# Patient Record
Sex: Female | Born: 1935 | Race: White | Hispanic: No | Smoking: Former smoker
Health system: Southern US, Community
[De-identification: ages and names within clinical notes are randomized; demographics above are authoritative.]

## PROBLEM LIST (undated history)

## (undated) DIAGNOSIS — J449 Chronic obstructive pulmonary disease, unspecified: Secondary | ICD-10-CM

## (undated) DIAGNOSIS — C679 Malignant neoplasm of bladder, unspecified: Secondary | ICD-10-CM

## (undated) DIAGNOSIS — J45909 Unspecified asthma, uncomplicated: Secondary | ICD-10-CM

## (undated) DIAGNOSIS — I10 Essential (primary) hypertension: Secondary | ICD-10-CM

## (undated) DIAGNOSIS — K589 Irritable bowel syndrome without diarrhea: Secondary | ICD-10-CM

## (undated) DIAGNOSIS — N3090 Cystitis, unspecified without hematuria: Secondary | ICD-10-CM

## (undated) DIAGNOSIS — R319 Hematuria, unspecified: Secondary | ICD-10-CM

## (undated) DIAGNOSIS — K219 Gastro-esophageal reflux disease without esophagitis: Secondary | ICD-10-CM

## (undated) DIAGNOSIS — E538 Deficiency of other specified B group vitamins: Secondary | ICD-10-CM

## (undated) DIAGNOSIS — N6019 Diffuse cystic mastopathy of unspecified breast: Secondary | ICD-10-CM

## (undated) DIAGNOSIS — Z8719 Personal history of other diseases of the digestive system: Secondary | ICD-10-CM

## (undated) DIAGNOSIS — E785 Hyperlipidemia, unspecified: Secondary | ICD-10-CM

## (undated) HISTORY — DX: Gastro-esophageal reflux disease without esophagitis: K21.9

## (undated) HISTORY — DX: Chronic obstructive pulmonary disease, unspecified: J44.9

## (undated) HISTORY — DX: Malignant neoplasm of bladder, unspecified: C67.9

## (undated) HISTORY — DX: Hematuria, unspecified: R31.9

## (undated) HISTORY — DX: Cystitis, unspecified without hematuria: N30.90

---

## 1962-02-05 HISTORY — PX: BREAST CYST EXCISION: SHX579

## 1977-02-05 HISTORY — PX: PARTIAL HYSTERECTOMY: SHX80

## 2004-01-04 ENCOUNTER — Ambulatory Visit: Payer: Self-pay | Admitting: Unknown Physician Specialty

## 2004-07-20 ENCOUNTER — Ambulatory Visit: Payer: Self-pay | Admitting: Unknown Physician Specialty

## 2004-10-10 ENCOUNTER — Ambulatory Visit: Payer: Self-pay | Admitting: Internal Medicine

## 2004-11-07 ENCOUNTER — Ambulatory Visit: Payer: Self-pay | Admitting: Internal Medicine

## 2004-12-20 ENCOUNTER — Ambulatory Visit: Payer: Self-pay | Admitting: Internal Medicine

## 2005-09-06 ENCOUNTER — Ambulatory Visit: Payer: Self-pay | Admitting: Internal Medicine

## 2005-10-17 ENCOUNTER — Ambulatory Visit: Payer: Self-pay | Admitting: Internal Medicine

## 2006-03-20 ENCOUNTER — Ambulatory Visit: Payer: Self-pay | Admitting: Unknown Physician Specialty

## 2006-04-03 ENCOUNTER — Ambulatory Visit: Payer: Self-pay | Admitting: Internal Medicine

## 2006-09-10 ENCOUNTER — Ambulatory Visit: Payer: Self-pay | Admitting: Internal Medicine

## 2006-10-30 ENCOUNTER — Ambulatory Visit: Payer: Self-pay | Admitting: Internal Medicine

## 2007-09-29 ENCOUNTER — Encounter: Payer: Self-pay | Admitting: Unknown Physician Specialty

## 2007-10-06 ENCOUNTER — Ambulatory Visit: Payer: Self-pay | Admitting: Internal Medicine

## 2007-10-07 ENCOUNTER — Encounter: Payer: Self-pay | Admitting: Unknown Physician Specialty

## 2007-12-07 ENCOUNTER — Encounter: Payer: Self-pay | Admitting: Unknown Physician Specialty

## 2008-01-06 DIAGNOSIS — J439 Emphysema, unspecified: Secondary | ICD-10-CM

## 2008-01-06 DIAGNOSIS — K219 Gastro-esophageal reflux disease without esophagitis: Secondary | ICD-10-CM

## 2008-01-07 ENCOUNTER — Ambulatory Visit: Payer: Self-pay | Admitting: Internal Medicine

## 2008-01-09 ENCOUNTER — Telehealth (INDEPENDENT_AMBULATORY_CARE_PROVIDER_SITE_OTHER): Payer: Self-pay | Admitting: *Deleted

## 2008-03-10 ENCOUNTER — Ambulatory Visit: Payer: Self-pay | Admitting: Internal Medicine

## 2008-04-16 ENCOUNTER — Telehealth (INDEPENDENT_AMBULATORY_CARE_PROVIDER_SITE_OTHER): Payer: Self-pay | Admitting: *Deleted

## 2008-06-24 ENCOUNTER — Ambulatory Visit: Payer: Self-pay | Admitting: Internal Medicine

## 2008-06-24 DIAGNOSIS — I10 Essential (primary) hypertension: Secondary | ICD-10-CM | POA: Insufficient documentation

## 2008-10-07 ENCOUNTER — Ambulatory Visit: Payer: Self-pay | Admitting: Internal Medicine

## 2009-01-20 DIAGNOSIS — N3020 Other chronic cystitis without hematuria: Secondary | ICD-10-CM | POA: Insufficient documentation

## 2009-02-01 ENCOUNTER — Ambulatory Visit: Payer: Self-pay | Admitting: Urology

## 2009-02-07 DIAGNOSIS — R3129 Other microscopic hematuria: Secondary | ICD-10-CM

## 2009-02-10 ENCOUNTER — Ambulatory Visit: Payer: Self-pay | Admitting: Urology

## 2009-02-23 ENCOUNTER — Ambulatory Visit: Payer: Self-pay | Admitting: Urology

## 2009-03-08 DIAGNOSIS — C672 Malignant neoplasm of lateral wall of bladder: Secondary | ICD-10-CM

## 2009-05-20 DIAGNOSIS — N3946 Mixed incontinence: Secondary | ICD-10-CM

## 2009-05-20 DIAGNOSIS — N993 Prolapse of vaginal vault after hysterectomy: Secondary | ICD-10-CM

## 2009-07-07 ENCOUNTER — Ambulatory Visit: Payer: Self-pay | Admitting: Internal Medicine

## 2009-08-01 ENCOUNTER — Ambulatory Visit: Payer: Self-pay | Admitting: Internal Medicine

## 2009-08-01 ENCOUNTER — Encounter: Payer: Self-pay | Admitting: Internal Medicine

## 2009-08-18 ENCOUNTER — Ambulatory Visit: Payer: Self-pay | Admitting: Internal Medicine

## 2009-09-28 ENCOUNTER — Telehealth (INDEPENDENT_AMBULATORY_CARE_PROVIDER_SITE_OTHER): Payer: Self-pay | Admitting: *Deleted

## 2009-10-11 ENCOUNTER — Ambulatory Visit: Payer: Self-pay | Admitting: Internal Medicine

## 2009-11-24 ENCOUNTER — Ambulatory Visit: Payer: Self-pay | Admitting: Internal Medicine

## 2009-11-24 DIAGNOSIS — B370 Candidal stomatitis: Secondary | ICD-10-CM

## 2009-11-28 ENCOUNTER — Telehealth (INDEPENDENT_AMBULATORY_CARE_PROVIDER_SITE_OTHER): Payer: Self-pay | Admitting: *Deleted

## 2009-12-13 ENCOUNTER — Ambulatory Visit: Payer: Self-pay | Admitting: Internal Medicine

## 2010-01-26 ENCOUNTER — Ambulatory Visit: Payer: Self-pay | Admitting: Internal Medicine

## 2010-02-21 ENCOUNTER — Ambulatory Visit: Payer: Self-pay | Admitting: Unknown Physician Specialty

## 2010-02-23 LAB — PATHOLOGY REPORT

## 2010-03-07 NOTE — Assessment & Plan Note (Signed)
Summary: Pulmonary/ f/u sinus flare, hfa 25% p coaching but doing well   Visit Type:  Follow-up Primary Provider/Referring Provider:  Dr. Bethann Punches  CC:  COPD follow-up...no changes in her breathing  on Symbicort...thrush has cleared...rare use of Combivent.  History of Present Illness: 69  yowf quit smoking in 1964  history of of COPD  GOLD I by PFT's   March 10, 2008  ov completely back to baseline, no doe but very sedentary, no sign cough or nocturnal or early am symptoms or need for rescue rx.  Jun 24, 2008 not really but bad reflux, stopped boniva, no better.  Only occ active enough to be sob with activity, no real variability or increase need for combivent.   July 07, 2009  Acute visit on lisinopril  Pt c/o chest tightness and mild increase in SOB over the past month.  She has been using combivent 2 x per day. She relates symptoms to stress at home.  She has had some cough over the past few days- prod with minimal clear sputum. Try off Lisinopril Diovan 80/12.5 for at least 6 weeks Work on inhaler technique:  relax and blow all the way out then take a nice smooth deep breath back in, triggering the inhaler at same time you start breathing in   Prednisone 10 mg 4 each am x 2days, 2x2days, 1x2days and stop Please schedule a follow-up appointment in 6 weeks, sooner if needed with PFT's and cxr on return  August 18, 2009 Followup to review PFT's.  Pt states that her breathing has been doing well.  She denies any complaints today. Cough has improved.   November 24, 2009--Presents for an acute office visit. Pt states was seen by Dr. Marion Downer ENT and  dx with thrush and treated with Majic Mouthwash. Dr. Willeen Cass found "white patches" in throat during scope and advised pt it was from using Advair. Pt has once day of Advair left.  Initially had persistent sore throat that prompted her to go to ENT. Since last visit her breathing has been doing well w/ no flare in symptom cough/wheezing or  dyspnea. rec change symbicort 80 instead of advair  December 13, 2009 COPD follow-up...no changes in her breathing  on Symbicort...thrush has cleared...rare use of Combivent,  rx with diflucan then needed augmentin for sinus flare x 7dasy completeled Nov 1.  presently Pt denies any   sore throat, dysphagia, itching, sneezing, cp, swelling.  Current Medications (verified): 1)  Symbicort 80-4.5 Mcg/act Aero (Budesonide-Formoterol Fumarate) .... 2 Puffs Two Times A Day 2)  Fluticasone Propionate 50 Mcg/act Susp (Fluticasone Propionate) .... 2 Sprays Each Nostril Once Daily 3)  Multivitamins  Tabs (Multiple Vitamin) .... Take 1 Tablet By Mouth Once A Day 4)  Nexium 40 Mg Cpdr (Esomeprazole Magnesium) .... Take 1 Tablet By Mouth Every Morning 5)  Calcium 600/vitamin D 600-400 Mg-Unit Tabs (Calcium Carbonate-Vitamin D) .... Take 1 Tablet By Mouth Once A Day 6)  Allegra Odt 30 Mg Tbdp (Fexofenadine Hcl) .Marland Kitchen.. 1 Once Daily As Needed 7)  Avalide 300-12.5 Mg Tabs (Irbesartan-Hydrochlorothiazide) .... One Half Daily 8)  Zantac 150 Maximum Strength 150 Mg Tabs (Ranitidine Hcl) .... Take 1 Tab By Mouth At Bedtime 9)  Vitamin D 1000 Unit Tabs (Cholecalciferol) .... Take 1 Tablet By Mouth Once A Day 10)  Combivent 103-18 Mcg/act Aero (Ipratropium-Albuterol) .... Inhale 2 Puffs Every 4 Hours As Needed  Allergies (verified): 1)  ! Sulfa  Past History:  Past Medical  History: G E R D (ICD-530.81)     - try off ace July 07, 2009 > symptoms resolved C O P D (ICD-496)    - PFT's 12/20/04 FEV1 1.81 (107%) ratio 57,  DLC0 81%,  no resopnse to B2    - PFT's 08/01/09   FEV1 1.64 (104%) ratio 54  DLCO 60, corrects to 71%    - HFA 25% December 13, 2009  OSTEOPOROSIS  Vital Signs:  Patient profile:   77 year old female Height:      61 inches (154.94 cm) Weight:      124 pounds (56.36 kg) BMI:     23.51 O2 Sat:      97 % on Room air Temp:     97.8 degrees F (36.56 degrees C) oral Pulse rate:   70 / minute BP  sitting:   116 / 74  (left arm) Cuff size:   regular  Vitals Entered By: Michel Bickers CMA (December 13, 2009 8:56 AM)  O2 Sat at Rest %:  97 O2 Flow:  Room air CC: COPD follow-up...no changes in her breathing  on Symbicort...thrush has cleared...rare use of Combivent Comments Medications reviewed with patient Michel Bickers [facility]  December 13, 2009 8:57 AM   Physical Exam  Additional Exam:  wt   128  Jun 24, 2008 > 127 July 07, 2009> 127 August 18, 2009    >>123 November 24, 2009 > 121 December 13, 2009  amb pleasant wf  HEENT mild turbinate edema.  Oropharynx no thrush or excess pnd or cobblestoning.  No JVD or cervical adenopathy. Mild accessory muscle hypertrophy. Trachea midline, nl thryroid. Chest was hyperinflated by percussion with diminished breath sounds and moderate increased exp time without wheeze. Hoover sign positive at mid inspiration. Regular rate and rhythm without murmur gallop or rub or increase P2 or edema.  Abd: no hsm, nl excursion. Ext warm without cyanosis or clubbing.     Impression & Recommendations:  Problem # 1:  C O P D (ICD-496)   DDX of  difficult airways managment all start with A and  include Adherence, Ace Inhibitors, Acid Reflux, Active Sinus Disease, Alpha 1 Antitripsin deficiency, Anxiety masquerading as Airways dz,  ABPA,  allergy(esp in young), Aspiration (esp in elderly), Adverse effects of DPI,  Active smokers, plus one B  = Beta blocker use..   Adherence still not optimal.  not using symbicort effectively nor first thing in am as rec but saba use way down I spent extra time with the patient today explaining optimal mdi  technique.  This improved from 10-25% which means she probably doesn't have much of an allergic/ ashtmatic component at this point and will leave well enough alone for now  ACEI  >  much better on arb  ?acid reflux:  diet, meds reviewed   Each maintenance medication was reviewed in detail including most importantly the difference  between maintenance and as needed and under what circumstances the prns are to be used.   Medications Added to Medication List This Visit: 1)  Symbicort 80-4.5 Mcg/act Aero (Budesonide-formoterol fumarate) .... 2 puffs first thing  in am and 2 puffs again in pm about 12 hours later  Other Orders: Est. Patient Level III (56213) HFA Instruction [phone])  Patient Instructions: 1)  Symbicort 2 puffs first thing  in am and 2 puffs again in pm about 12 hours later  2)  Work on inhaler technique:  relax and blow  all the way out then take a nice smooth deep breath back in, triggering the inhaler at same time you start breathing in then rinse gargle and brush teeth 3)  Please schedule a follow-up appointment as needed. Prescriptions: SYMBICORT 80-4.5 MCG/ACT AERO (BUDESONIDE-FORMOTEROL FUMARATE) 2 puffs first thing  in am and 2 puffs again in pm about 12 hours later  #1 x 11   Entered and Authorized by:   Nyoka Cowden MD   Signed by:   Nyoka Cowden MD on 12/13/2009   Method used:   [facility]        [facility] [phone]* (retail)       [address] [place], [place]  [address]       Ph: [phone] or [phone]       Fax: [phone]   RxID:   [id number]    Immunization History:  Influenza Immunization History:    Influenza:  historical (10/06/2009)

## 2010-03-07 NOTE — Miscellaneous (Signed)
Summary: Orders Update  Clinical Lists Changes  Orders: Added new Test order of T-2 View CXR (71020TC) - Signed 

## 2010-03-07 NOTE — Assessment & Plan Note (Signed)
Summary: Pulmonary/ acute ext ov with hfa teaching   Primary Provider/Referring Provider:  Dr. Bethann Punches  CC:  Acute visit. Pt c/o chest tightness and mild increase in SOB over the past month.  She has been using combivent 2 x per day. She relates symptoms to stress at home.  She has had some cough over the past few days- prod with minimal clear sputum.Marland Kitchen  History of Present Illness: 83  yowf quit smoking in 1964  history of of COPD    March 10, 2008  ov completely back to baseline, no doe but very sedentary, no sign cough or nocturnal or early am symptoms or need for rescue rx.  Jun 24, 2008 not really but bad reflux, stopped boniva, no better.  Only occ active enough to be sob with activity, no real variability or increase need for combivent.   July 07, 2009 ov Acute visit. Pt c/o chest tightness and mild increase in SOB over the past month.  She has been using combivent 2 x per day. She relates symptoms to stress at home.  She has had some cough over the past few days- prod with minimal clear sputum. Pt denies any significant sore throat, dysphagia, itching, sneezing,  nasal congestion or excess secretions,  fever, chills, sweats, unintended wt loss, pleuritic or exertional cp, hempoptysis, change in activity tolerance  orthopnea pnd or leg swelling Pt also denies any obvious fluctuation in symptoms with weather or environmental change or other alleviating or aggravating factors.       Current Medications (verified): 1)  Advair Diskus 100-50 Mcg/dose  Misc (Fluticasone-Salmeterol) .... One Puff Twice Daily 2)  Nasacort Aq 55 Mcg/act Aers (Triamcinolone Acetonide(Nasal)) .... Take 2 Sprays in Each Nostril Once A Day 3)  Multivitamins  Tabs (Multiple Vitamin) .... Take 1 Tablet By Mouth Once A Day 4)  Zegerid 40-1100 Mg Caps (Omeprazole-Sodium Bicarbonate) .... Take 1 Capsule By Mouth Once A Day 5)  Calcium 600/vitamin D 600-400 Mg-Unit Tabs (Calcium Carbonate-Vitamin D) .... Take 1  Tablet By Mouth Once A Day 6)  Ester-C 1000-50 Mg Tabs (Bioflavonoid Products) .... Take 1 Tablet By Mouth Once A Day 7)  Lisinopril-Hydrochlorothiazide 10-12.5 Mg Tabs (Lisinopril-Hydrochlorothiazide) .... 1/2 Once Daily and 1/2 Extra If Needed 8)  Combivent 103-18 Mcg/act Aero (Ipratropium-Albuterol) .... Inhale 2 Puffs Every 4 Hours As Needed 9)  Allergy Clear .Marland Kitchen.. 1 Once Daily As Needed 10)  Allegra Odt 30 Mg Tbdp (Fexofenadine Hcl) .Marland Kitchen.. 1 Once Daily As Needed  Allergies (verified): 1)  ! Sulfa  Past History:  Past Medical History: [name] (ICD-530.81)     - try off ace July 07, 2009  C O P D (ICD-496)    - PFT's 12/20/04 FEV1 1.81 (107%) ratio 57,  DLC0 81%,  no resopnse to B2 OSTEOPOROSIS  Vital Signs:  Patient profile:   77 year old female Weight:      127 pounds BMI:     24.08 O2 Sat:      97 % on Room air Temp:     98.2 degrees F oral Pulse rate:   76 / minute BP sitting:   120 / 64  (left arm)  Vitals Entered By: Vernie Murders (July 07, 2009 10:50 AM)  O2 Flow:  Room air  Physical Exam  Additional Exam:  wt 127  > 128  Jun 24, 2008 > 127 July 07, 2009 and pseudowheeze resolves with purse lip maneuver  HEENT mild  turbinate edema.  Oropharynx no thrush or excess pnd or cobblestoning.  No JVD or cervical adenopathy. Mild accessory muscle hypertrophy. Trachea midline, nl thryroid. Chest was hyperinflated by percussion with diminished breath sounds and moderate increased exp time without wheeze. Hoover sign positive at mid inspiration. Regular rate and rhythm without murmur gallop or rub or increase P2 or edema.  Abd: no hsm, nl excursion. Ext warm without cyanosis or clubbing.      Impression & Recommendations:  Problem # 1:  C O P D (ICD-496)   DDX of  difficult airways managment all start with A and  include Adherence, Ace Inhibitors, Acid Reflux, Active Sinus Disease, Alpha 1 Antitripsin deficiency, Anxiety masquerading as Airways dz,  ABPA,  allergy(esp in  young), Aspiration (esp in elderly), Adverse effects of DPI,  Active smokers, plus one B  = Beta blocker use..    Flare on advair with prominent pseudowheeze resolves with purse lip maneuver favors adverse effect of ace or dpi - try off ace first then back here for pft's  Adherence: I spent extra time with the patient today explaining optimal mdi  technique.  This improved from  50-75%  Problem # 2:  HYPERTENSION, BENIGN (ICD-401.1)  ACE inhibitors are problematic in  pts with airway complaints because  even experienced pulmonologists can't always distinguish ace effects from copd/asthma.  By themselves they don't actually cause a problem, much like oxygen can't by itself start a fire, but they certainly serve as a powerful catalyst or enhancer for any "fire"  or inflammatory process in the upper airway, be it caused by an ET  tube or more commonly reflux (especially in the obese or pts with known GERD or who are on biphoshonates).    In the era of ARB near equivalency until we have a better handle on the reversibility of the airway problem, it just makes sense to avoid ace entirely in the short run and then decide later, having established a level of airway control using a reasonable limited regimen, whether to add back ace but even then being very careful to observe the pt for worsening airway control and number of meds used/ needed to control symptoms.    Her updated medication list for this problem includes:    Lisinopril-hydrochlorothiazide 10-12.5 Mg Tabs (Lisinopril-hydrochlorothiazide) .Marland Kitchen... 1/2 once daily and 1/2 extra if needed    Diovan Hct 80-12.5 Mg Tabs (Valsartan-hydrochlorothiazide) ..... One daily  Medications Added to Medication List This Visit: 1)  Lisinopril-hydrochlorothiazide 10-12.5 Mg Tabs (Lisinopril-hydrochlorothiazide) .... 1/2 once daily and 1/2 extra if needed 2)  Allergy Clear  .Marland Kitchen.. 1 once daily as needed 3)  Allegra Odt 30 Mg Tbdp (Fexofenadine hcl) .Marland Kitchen.. 1 once  daily as needed 4)  Diovan Hct 80-12.5 Mg Tabs (Valsartan-hydrochlorothiazide) .... One daily 5)  Prednisone 10 Mg Tabs (Prednisone) .... 4 each am x 2days, 2x2days, 1x2days and stop  Other Orders: Est. Patient Level IV (16109) HFA Instruction [phone]) Prescription Created Electronically [phone])  Patient Instructions: 1)  Try off Lisinopril 2)  Diovan 80/12.5 for at least 6 weeks 3)  Work on inhaler technique:  relax and blow all the way out then take a nice smooth deep breath back in, triggering the inhaler at same time you start breathing in  4)  GERD (REFLUX)  is a common cause of respiratory symptoms. It commonly presents without heartburn and can be treated with medication, but also with lifestyle changes including avoidance of late meals, excessive alcohol, smoking cessation, and avoid  fatty foods, chocolate, peppermint, colas, red wine, and acidic juices such as orange juice. NO MINT OR MENTHOL PRODUCTS SO NO COUGH DROPS  5)  USE SUGARLESS CANDY INSTEAD (jolley ranchers)  6)  NO OIL BASED VITAMINS  7)  Prednisone 10 mg 4 each am x 2days, 2x2days, 1x2days and stop 8)  Please schedule a follow-up appointment in 6 weeks, sooner if needed with PFT's and cxr on return Prescriptions: PREDNISONE 10 MG  TABS (PREDNISONE) 4 each am x 2days, 2x2days, 1x2days and stop  #14 x 0   Entered and Authorized by:   Nyoka Cowden MD   Signed by:   Nyoka Cowden MD on 07/07/2009   Method used:   [facility] to        [facility] [phone][facility] (retail)       [address] [place], [place]  [address]       Ph: [phone] or [phone]       Fax: [phone]   RxID:   [id number]

## 2010-03-07 NOTE — Assessment & Plan Note (Signed)
Summary: Pulmonary/ ext summary final f/u ov   Primary Provider/Referring Provider:  Dr. Bethann Punches  CC:  Followup to review PFT's.  Pt states that her breathing has been doing well.  She denies any complaints today. Cough has improved.Marland Kitchen  History of Present Illness: 86  yowf quit smoking in 1964  history of of COPD  GOLD I by PFT's    March 10, 2008  ov completely back to baseline, no doe but very sedentary, no sign cough or nocturnal or early am symptoms or need for rescue rx.  Jun 24, 2008 not really but bad reflux, stopped boniva, no better.  Only occ active enough to be sob with activity, no real variability or increase need for combivent.   July 07, 2009  Acute visit on lisinopril  Pt c/o chest tightness and mild increase in SOB over the past month.  She has been using combivent 2 x per day. She relates symptoms to stress at home.  She has had some cough over the past few days- prod with minimal clear sputum. Try off Lisinopril Diovan 80/12.5 for at least 6 weeks Work on inhaler technique:  relax and blow all the way out then take a nice smooth deep breath back in, triggering the inhaler at same time you start breathing in   Prednisone 10 mg 4 each am x 2days, 2x2days, 1x2days and stop Please schedule a follow-up appointment in 6 weeks, sooner if needed with PFT's and cxr on return  August 18, 2009 Followup to review PFT's.  Pt states that her breathing has been doing well.  She denies any complaints today. Cough has improved. Pt denies any significant sore throat, dysphagia, itching, sneezing,  nasal congestion or excess secretions,  fever, chills, sweats, unintended wt loss, pleuritic or exertional cp, hempoptysis, change in activity tolerance  orthopnea pnd or leg swelling   Current Medications (verified): 1)  Advair Diskus 100-50 Mcg/dose  Misc (Fluticasone-Salmeterol) .... One Puff Twice Daily 2)  Fluticasone Propionate 50 Mcg/act Susp (Fluticasone Propionate) .... 2 Sprays Each  Nostril Once Daily 3)  Multivitamins  Tabs (Multiple Vitamin) .... Take 1 Tablet By Mouth Once A Day 4)  Zegerid 40-1100 Mg Caps (Omeprazole-Sodium Bicarbonate) .... Take 1 Capsule By Mouth Once A Day 5)  Calcium 600/vitamin D 600-400 Mg-Unit Tabs (Calcium Carbonate-Vitamin D) .... Take 1 Tablet By Mouth Once A Day 6)  Combivent 103-18 Mcg/act Aero (Ipratropium-Albuterol) .... Inhale 2 Puffs Every 4 Hours As Needed 7)  Allegra Odt 30 Mg Tbdp (Fexofenadine Hcl) .Marland Kitchen.. 1 Once Daily As Needed 8)  Diovan Hct 80-12.5 Mg Tabs (Valsartan-Hydrochlorothiazide) .... One Daily  Allergies (verified): 1)  ! Sulfa  Past History:  Past Medical History: [name] (ICD-530.81)     - try off ace July 07, 2009 > symptoms resolved C O P D (ICD-496)    - PFT's 12/20/04 FEV1 1.81 (107%) ratio 57,  DLC0 81%,  no resopnse to B2    - PFT's 62711     FEV1 1/64 (104%) ratio 54  DLCO 60, corrects to 71% OSTEOPOROSIS  Vital Signs:  Patient profile:   77 year old female Weight:      127.50 pounds O2 Sat:      97 % on Room air Temp:     98.1 degrees F oral Pulse rate:   67 / minute BP sitting:   126 / 78  (left arm)  Vitals Entered ByVernie Murders (August 18, 2009 11:09  AM)  O2 Flow:  Room air  Physical Exam  Additional Exam:  wt   128  Jun 24, 2008 > 127 July 07, 2009> 127 August 18, 2009     amb pleasant wf no longer pseudowheeze  HEENT mild turbinate edema.  Oropharynx no thrush or excess pnd or cobblestoning.  No JVD or cervical adenopathy. Mild accessory muscle hypertrophy. Trachea midline, nl thryroid. Chest was hyperinflated by percussion with diminished breath sounds and moderate increased exp time without wheeze. Hoover sign positive at mid inspiration. Regular rate and rhythm without murmur gallop or rub or increase P2 or edema.  Abd: no hsm, nl excursion. Ext warm without cyanosis or clubbing.     Impression & Recommendations:  Problem # 1:  C O P D (ICD-496) DDX of  difficult airways managment all  start with A and  include Adherence, Ace Inhibitors, Acid Reflux, Active Sinus Disease, Alpha 1 Antitripsin deficiency, Anxiety masquerading as Airways dz,  ABPA,  allergy(esp in young), Aspiration (esp in elderly), Adverse effects of DPI,  Active smokers, plus one B  = Beta blocker use..    Flare on advair with prominent pseudowheeze resolves with purse lip maneuver favors adverse effect of ace or dpi - tried  off ace first and better to her safisfaction so no further w/u needed  Acid reflux addressed with zegrid    Problem # 2:  HYPERTENSION, BENIGN (ICD-401.1)  The following medications were removed from the medication list:    Lisinopril-hydrochlorothiazide 10-12.5 Mg Tabs (Lisinopril-hydrochlorothiazide) .Marland Kitchen... 1/2 once daily and 1/2 extra if needed    Diovan Hct 80-12.5 Mg Tabs (Valsartan-hydrochlorothiazide) ..... One daily Her updated medication list for this problem includes:    Avalide 300-12.5 Mg Tabs (Irbesartan-hydrochlorothiazide) ..... One half daily   ACE inhibitors are problematic in  pts with airway complaints because  even experienced pulmonologists can't always distinguish ace effects from copd/asthma.  By themselves they don't actually cause a problem, much like oxygen can't by itself start a fire, but they certainly serve as a powerful catalyst or enhancer for any "fire"  or inflammatory process in the upper airway, be it caused by an ET  tube or more commonly reflux (especially in the obese or pts with known GERD or who are on biphoshonates).  In the era of ARB near equivalency until we have a better handle on the reversibility of the airway problem, it just makes sense to avoid ace entirely in the short run and then decide later, having established a level of airway control using a reasonable limited regimen, whether to add back ace but even then being very careful to observe the pt for worsening airway control and number of meds used/ needed to control symptoms.     Orders: Est. Patient Level IV (66063)  Medications Added to Medication List This Visit: 1)  Fluticasone Propionate 50 Mcg/act Susp (Fluticasone propionate) .... 2 sprays each nostril once daily 2)  Avalide 300-12.5 Mg Tabs (Irbesartan-hydrochlorothiazide) .... One half daily  Patient Instructions: 1)  ok to try avalide 300/12/5 one half daily but if not satisfied see Dr Hyacinth Meeker. 2)  If your breathing worsens or you need to use your rescue inhaler more than twice weekly or wake up more than twice a month with any respiratory symptoms or require more than two rescue inhalers per year, we need to see you right away. 3)  otherwise follow here is as needed 4)  Copy sent to: Bethann Punches at [facility] Prescriptions:  AVALIDE 300-12.5 MG TABS (IRBESARTAN-HYDROCHLOROTHIAZIDE) one half daily  #34 x 11   Entered and Authorized by:   Nyoka Cowden MD   Signed by:   Nyoka Cowden MD on 08/18/2009   Method used:   Print then Give to Patient   RxID:   [id number]

## 2010-03-07 NOTE — Progress Notes (Signed)
Summary: avalide refilled  Phone Note Call from Patient Call back at Home Phone [phone]   Caller: Patient Call For: wert Summary of Call: pt states that the samples of AVALIDE given to her at last ov worked well. she [facility] [address] [facility] [place].  Initial call taken by: Tivis Ringer, CNA,  September 28, 2009 9:47 AM  Follow-up for Phone Call        Rx was sent to pharm.  Spoke with pt and notified this was done. Follow-up by: Vernie Murders,  September 28, 2009 9:50 AM    Prescriptions: Adan Sis 300-12.5 MG TABS (IRBESARTAN-HYDROCHLOROTHIAZIDE) one half daily  #34 x 11   Entered by:   Vernie Murders   Authorized by:   Nyoka Cowden MD   Signed by:   Vernie Murders on 09/28/2009   Method used:   [facility]        [facility] [phone][facility] (retail)       [address] [place], [place]  [address]       Ph: [phone] or [phone]       Fax: [phone]   RxID:   [id number]

## 2010-03-07 NOTE — Miscellaneous (Signed)
Summary: Orders Update pft charges  Clinical Lists Changes  Orders: Added new Service order of Carbon Monoxide diffusing w/capacity (94720) - Signed Added new Service order of Lung Volumes (94240) - Signed Added new Service order of Spirometry (Pre & Post) (94060) - Signed 

## 2010-03-07 NOTE — Progress Notes (Signed)
Summary: sinus drainage  Phone Note Call from Patient   Caller: Patient Call For: wert Summary of Call: sinus drainage  [facility] [address], [place] Initial call taken by: Rickard Patience,  November 28, 2009 1:20 PM  Follow-up for Phone Call        Pt seen by TP 11-25-09, stopped Advair and started Symbicort 80/4.24mcg 2 puffs two times a day. Pt c/o increased PND, productive cough with small amount of thick green mucus. Pt states Zpak does not work for her. Will forward to the "doc of the day" for advisement. Thanks. Zackery Barefoot CMA  November 28, 2009 2:10 PM   Allergies: Sulfa drugs  Additional Follow-up for Phone Call Additional follow up Details #1::        Call in augmentin 875mg  by mouth two times a day x 7days Pt will need OV soon next week Additional Follow-up by: Storm Frisk MD,  November 28, 2009 2:12 PM    Additional Follow-up for Phone Call Additional follow up Details #2::    Rx for augmentin was sent to pharm.  Pt aware.  Appt was sched with MW for 12/13/09 at 9 am Follow-up by: Vernie Murders,  November 28, 2009 2:21 PM  New/Updated Medications: AUGMENTIN 875-125 MG TABS (AMOXICILLIN-POT CLAVULANATE) 1 two times a day until gone Prescriptions: AUGMENTIN 875-125 MG TABS (AMOXICILLIN-POT CLAVULANATE) 1 two times a day until gone  #14 x 0   Entered by:   Vernie Murders   Authorized by:   Storm Frisk MD   Signed by:   Vernie Murders on 11/28/2009   Method used:   Electronically to        [facility] [phone]* (retail)       [address] [place], [place]  [address]       Ph: [phone] or [phone]       Fax: [phone]   RxID:   [id number]

## 2010-03-07 NOTE — Assessment & Plan Note (Signed)
Summary: acid reflux/ mbw   Visit Type:  NP acute visit  Primary Provider/Referring Provider:  Dr. Bethann Punches  CC:  Pt states was seen by Dr. Marion Downer ENT and  dx with thrush and treated with Majic Mouthwash. Dr. Willeen Cass found "white patches" in throat during scope and advised pt it was from using Advair. Pt has once day of Advair left. c/o sore throat.  History of Present Illness: 77  yowf quit smoking in 1964  history of of COPD  GOLD I by PFT's   March 10, 2008  ov completely back to baseline, no doe but very sedentary, no sign cough or nocturnal or early am symptoms or need for rescue rx.  Jun 24, 2008 not really but bad reflux, stopped boniva, no better.  Only occ active enough to be sob with activity, no real variability or increase need for combivent.   July 07, 2009  Acute visit on lisinopril  Pt c/o chest tightness and mild increase in SOB over the past month.  She has been using combivent 2 x per day. She relates symptoms to stress at home.  She has had some cough over the past few days- prod with minimal clear sputum. Try off Lisinopril Diovan 80/12.5 for at least 6 weeks Work on inhaler technique:  relax and blow all the way out then take a nice smooth deep breath back in, triggering the inhaler at same time you start breathing in   Prednisone 10 mg 4 each am x 2days, 2x2days, 1x2days and stop Please schedule a follow-up appointment in 6 weeks, sooner if needed with PFT's and cxr on return  August 18, 2009 Followup to review PFT's.  Pt states that her breathing has been doing well.  She denies any complaints today. Cough has improved.   November 24, 2009--Presents for an acute office visit. Pt states was seen by Dr. Marion Downer ENT and  dx with thrush and treated with Majic Mouthwash. Dr. Willeen Cass found "white patches" in throat during scope and advised pt it was from using Advair. Pt has once day of Advair left.  Initially had persistent sore throat that prompted her to go  to ENT. Since last visit her breathing has been doing well w/ no flare in symptom cough/wheezing or dyspnea. Denies chest pain, dyspnea, orthopnea, hemoptysis, fever, n/v/d, edema, headache. We went over inhaler technique. Pt was only rinsing with water after inhaler use.   Preventive Screening-Counseling & Management  Alcohol-Tobacco     Smoking Status: quit     Packs/Day: <0.25     Year Started: 1960     Year Quit: 1962  Medications Prior to Update: 1)  Advair Diskus 100-50 Mcg/dose  Misc (Fluticasone-Salmeterol) .... One Puff Twice Daily 2)  Fluticasone Propionate 50 Mcg/act Susp (Fluticasone Propionate) .... 2 Sprays Each Nostril Once Daily 3)  Multivitamins  Tabs (Multiple Vitamin) .... Take 1 Tablet By Mouth Once A Day 4)  Zegerid 40-1100 Mg Caps (Omeprazole-Sodium Bicarbonate) .... Take 1 Capsule By Mouth Once A Day 5)  Calcium 600/vitamin D 600-400 Mg-Unit Tabs (Calcium Carbonate-Vitamin D) .... Take 1 Tablet By Mouth Once A Day 6)  Allegra Odt 30 Mg Tbdp (Fexofenadine Hcl) .Marland Kitchen.. 1 Once Daily As Needed 7)  Avalide 300-12.5 Mg Tabs (Irbesartan-Hydrochlorothiazide) .... One Half Daily 8)  Combivent 103-18 Mcg/act Aero (Ipratropium-Albuterol) .... Inhale 2 Puffs Every 4 Hours As Needed  Current Medications (verified): 1)  Advair Diskus 100-50 Mcg/dose  Misc (Fluticasone-Salmeterol) .... One Puff Twice  Daily 2)  Fluticasone Propionate 50 Mcg/act Susp (Fluticasone Propionate) .... 2 Sprays Each Nostril Once Daily 3)  Multivitamins  Tabs (Multiple Vitamin) .... Take 1 Tablet By Mouth Once A Day 4)  Nexium 40 Mg Cpdr (Esomeprazole Magnesium) .... Take 1 Tablet By Mouth Every Morning 5)  Calcium 600/vitamin D 600-400 Mg-Unit Tabs (Calcium Carbonate-Vitamin D) .... Take 1 Tablet By Mouth Once A Day 6)  Combivent 103-18 Mcg/act Aero (Ipratropium-Albuterol) .... Inhale 2 Puffs Every 4 Hours As Needed 7)  Allegra Odt 30 Mg Tbdp (Fexofenadine Hcl) .Marland Kitchen.. 1 Once Daily As Needed 8)  Avalide  300-12.5 Mg Tabs (Irbesartan-Hydrochlorothiazide) .... One Half Daily 9)  Fluconazole 100 Mg Tabs (Fluconazole) .... Take 1 Tablet By Mouth Once A Day 10)  Zantac 150 Maximum Strength 150 Mg Tabs (Ranitidine Hcl) .... Take 1 Tab By Mouth At Bedtime 11)  Vitamin D 1000 Unit Tabs (Cholecalciferol) .... Take 1 Tablet By Mouth Once A Day  Allergies (verified): 1)  ! Sulfa  Past History:  Past Medical History: Last updated: 08/18/2009 G E R D (ICD-530.81)     - try off ace July 07, 2009 > symptoms resolved C O P D (ICD-496)    - PFT's 12/20/04 FEV1 1.81 (107%) ratio 57,  DLC0 81%,  no resopnse to B2    - PFT's 62711     FEV1 1/64 (104%) ratio 54  DLCO 60, corrects to 71% OSTEOPOROSIS  Past Surgical History: Last updated: 01/06/2008 partial hysterectomy-1979 benign breast cyst surgery-1964  Family History: Last updated: 01/06/2008 brother-emphysema mother-allergies  Social History: Last updated: 01/06/2008 Patient states former smoker. quit smoking 1964. Pt is married with children. Pt is retire.  Social History: Packs/Day:  <0.25  Review of Systems      See HPI  Vital Signs:  Patient profile:   77 year old female Height:      61 inches Weight:      123.8 pounds BMI:     23.48 O2 Sat:      98 % on Room air Temp:     97.0 degrees F oral Pulse rate:   79 / minute BP sitting:   108 / 60  (left arm) Cuff size:   regular  Vitals Entered By: Zackery Barefoot CMA (November 24, 2009 10:28 AM)  O2 Flow:  Room air CC: Pt states was seen by Dr. Marion Downer ENT and  dx with thrush and treated with Majic Mouthwash. Dr. Willeen Cass found "white patches" in throat during scope and advised pt it was from using Advair. Pt has once day of Advair left. c/o sore throat Is Patient Diabetic? No Comments Medications reviewed with patient Verified contact number and pharmacy with patient Zackery Barefoot [facility]  November 24, 2009 10:29 AM    Physical Exam  Additional Exam:  wt   128  Jun 24, 2008 > 127 July 07, 2009> 127 August 18, 2009    >>123 November 24, 2009 amb pleasant wf  HEENT mild turbinate edema.  Oropharynx no thrush or excess pnd or cobblestoning.  No JVD or cervical adenopathy. Mild accessory muscle hypertrophy. Trachea midline, nl thryroid. Chest was hyperinflated by percussion with diminished breath sounds and moderate increased exp time without wheeze. Hoover sign positive at mid inspiration. Regular rate and rhythm without murmur gallop or rub or increase P2 or edema.  Abd: no hsm, nl excursion. Ext warm without cyanosis or clubbing.     Impression & Recommendations:  Problem # 1:  C  O P D (ICD-496) Well compensated except for upper airway thrush secondary to inhaler (ICS) We discussed several options for her today , reviewed proper inhaler use  will change to symbicort for now  Plan:  Stop Advair  Begin Symbicort 80/4.70mcg 2 puffs two times a day , (2 samples given)brush, rinse and gargle after use, then drink cup of water.  Yogurt dialy  Saline nasal rinse--Neil med is a good brand.  Please contact office for sooner follow up if symptoms do not improve or worsen  follow up Dr. Sherene Sires in 4 weeks   Problem # 2:  CANDIDIASIS, ORAL (ICD-112.0) finish diflucan and follow up ent as discussed  inhaler ed. given Plan  Stop Advair  Begin Symbicort 80/4.84mcg 2 puffs two times a day , (2 samples given)brush, rinse and gargle after use, then drink cup of water.  Yogurt dialy  Saline nasal rinse--Neil med is a good brand.  Please contact office for sooner follow up if symptoms do not improve or worsen  follow up Dr. Sherene Sires in 4 weeks  Orders: Est. Patient Level IV ([address])  Medications Added to Medication List This Visit: 1)  Symbicort 80-4.5 Mcg/act Aero (Budesonide-formoterol fumarate) .... 2 puffs two times a day 2)  Nexium 40 Mg Cpdr (Esomeprazole magnesium) .... Take 1 tablet by mouth every morning 3)  Zantac 150 Maximum Strength 150 Mg Tabs (Ranitidine hcl)  .... Take 1 tab by mouth at bedtime 4)  Vitamin D 1000 Unit Tabs (Cholecalciferol) .... Take 1 tablet by mouth once a day 5)  Fluconazole 100 Mg Tabs (Fluconazole) .... Take 1 tablet by mouth once a day  Complete Medication List: 1)  Symbicort 80-4.5 Mcg/act Aero (Budesonide-formoterol fumarate) .... 2 puffs two times a day 2)  Fluticasone Propionate 50 Mcg/act Susp (Fluticasone propionate) .... 2 sprays each nostril once daily 3)  Multivitamins Tabs (Multiple vitamin) .... Take 1 tablet by mouth once a day 4)  Nexium 40 Mg Cpdr (Esomeprazole magnesium) .... Take 1 tablet by mouth every morning 5)  Calcium 600/vitamin D 600-400 Mg-unit Tabs (Calcium carbonate-vitamin d) .... Take 1 tablet by mouth once a day 6)  Allegra Odt 30 Mg Tbdp (Fexofenadine hcl) .Marland Kitchen.. 1 once daily as needed 7)  Avalide 300-12.5 Mg Tabs (Irbesartan-hydrochlorothiazide) .... One half daily 8)  Zantac 150 Maximum Strength 150 Mg Tabs (Ranitidine hcl) .... Take 1 tab by mouth at bedtime 9)  Vitamin D 1000 Unit Tabs (Cholecalciferol) .... Take 1 tablet by mouth once a day 10)  Combivent 103-18 Mcg/act Aero (Ipratropium-albuterol) .... Inhale 2 puffs every 4 hours as needed 11)  Fluconazole 100 Mg Tabs (Fluconazole) .... Take 1 tablet by mouth once a day  Patient Instructions: 1)  Stop Advair  2)  Begin Symbicort 80/4.58mcg 2 puffs two times a day , (2 samples given)brush, rinse and gargle after use, then drink cup of water.  3)  Yogurt dialy  4)  Saline nasal rinse--Neil med is a good brand.  5)  Please contact office for sooner follow up if symptoms do not improve or worsen  6)  follow up Dr. Sherene Sires in 4 weeks

## 2010-04-11 DIAGNOSIS — N390 Urinary tract infection, site not specified: Secondary | ICD-10-CM | POA: Insufficient documentation

## 2010-06-20 NOTE — Assessment & Plan Note (Signed)
[facility]                             PULMONARY OFFICE NOTE   JADZIA, IBSEN                        MRN:          [id number]  DATE:10/30/2006                            DOB:          Jun 22, 1930    PULMONARY EXTENDED ACUTE OFFICE EVALUATION.   HISTORY:  A 77 year old white female former smoker with documented COPD  but had done well on Advair at a dose of 100/50 b.i.d. until the last  several months with increasing symptoms of chest discomfort that occur  almost always at night and made better by using albuterol.  She  described the symptoms as someone  sitting on my chest, but assured me  that she immediately improved after albuterol use whenever it happened,  and it almost always happened at night.  It does have reproducibly with  exertion during the day and is not associated with any nausea,  diaphoresis or presyncope.   The patient is presently maintained on a combination of Advair 100/50  b.i.d. and Zegerid that she takes q.a.m. (apparently she has already  been diagnosed with reflux by her stomach doctor.   PHYSICAL EXAMINATION:  She is a pleasant ambulatory white female,  somewhat anxious but I feel in no acute distress.  Afebrile with normal  vital signs.  HEENT:  Unremarkable.  OROPHARYNX:  Clear.  LUNG FIELDS:  A few rhonchi bilaterally.  Upper airway is adequate.  HEART:  Regular rate and rhythm without any murmur, gallop or rub.  ABDOMEN:  Soft, benign.  EXTREMITIES:  Warm without any calf tenderness, cyanosis, clubbing,  edema.   Chest x-ray is pending.   MDI technique was reviewed and actually is quite poor.  With coaching,  she improves to about 75%.   IMPRESSION:  Nocturnal chest tightness in the absence of any daytime  symptoms with exertion, suggesting either nocturnal reflux or nocturnal  asthma.  The fact that it occurs almost immediately on lying down is  more consistent with nocturnal reflux.  Since she has  already known  documented reflux and Zegerid typically is best taken at bedtime anyway,  I am going to recommend she switch the Zegerid at bedtime and eliminate  Fish Oil from her diet for now.  If this solves her tightness, I will  defer her management back to Dr. Hyacinth Meeker and her GI doctor, noting that  she is also taking Boniva, which may aggravate reflux (now that we have  an IV form of biphosphonate available, that might be an option for this  patient).   I did review with her optimal treatment with Combivent and also tried to  help her understand the difference between angina, which can cause  similar symptoms but would not occur immediately on lying down nor be  partially relieved by Combivent and typically would be reproduced on  exertion at some point since the problem has been present for several  months.   If she continues to have symptoms nocturnally on Zegerid, I would  consider adding a promotility agent like Reglan to her regimen but will  defer this also to Dr.  Hyacinth Meeker and her GI physician.     Charlaine Dalton. Sherene Sires, MD, [facility]  Electronically Signed    MBW/MedQ  DD: 10/30/2006  DT: 10/31/2006  Job #: [id number]   cc:   Bethann Punches

## 2010-06-23 NOTE — Assessment & Plan Note (Signed)
[facility]                             PULMONARY OFFICE NOTE   Kelly, Drake                        MRN:          [id number]  DATE:04/03/2006                            DOB:          [birth year]    HISTORY OF PRESENT ILLNESS:  The patient is a 77 year old white female  patient of Dr. Sherene Sires with a known history of COPD, who presents for  routine follow-up.  The patient is maintained on Advair 100/50 mcg twice  daily and also uses Nasacort AQ two puffs twice daily.  The patient  presents for her annual routine office visit.  The patient reports that  since her last visit she has been doing exceptionally well.  Has  tolerated medications without any known difficulties.  The patient  rarely uses her Combivent rescue inhaler and reports that her exercise  tolerance is without change.   PAST MEDICAL HISTORY:  Reviewed.   CURRENT MEDICATIONS:  Reviewed.   PHYSICAL EXAMINATION:  GENERAL:  The patient is a pleasant female in no  acute distress.  VITAL SIGNS:  She is afebrile with stable vital signs.  O2 saturation is  99% on room air.  HEENT:  Unremarkable.  NECK:  Supple without cervical adenopathy, no JVD.  LUNGS:  Slightly diminished at the bases, otherwise clear.  CARDIAC:  Regular rate and rhythm.  ABDOMEN:  Soft and nontender.  EXTREMITIES:  Warm without any calf tenderness, clubbing or edema.   IMPRESSION AND PLAN:  Chronic obstructive pulmonary disease that is well-  compensated on Advair 100/50 mcg twice daily.  The patient will continue  on her present regimen.  Follow back up with Dr. Sherene Sires and pulmonary  follow-up can be on a p.r.n. basis.      Rubye Oaks, NP  Electronically Signed      Charlaine Dalton. Sherene Sires, MD, [facility]  Electronically Signed   TP/MedQ  DD: 04/08/2006  DT: 04/08/2006  Job #: [id number]

## 2010-09-20 ENCOUNTER — Emergency Department: Payer: Self-pay | Admitting: Emergency Medicine

## 2010-09-27 ENCOUNTER — Encounter: Payer: Self-pay | Admitting: Internal Medicine

## 2010-09-28 ENCOUNTER — Encounter: Payer: Self-pay | Admitting: Internal Medicine

## 2010-09-28 ENCOUNTER — Ambulatory Visit (INDEPENDENT_AMBULATORY_CARE_PROVIDER_SITE_OTHER): Payer: Medicare Other | Admitting: Internal Medicine

## 2010-09-28 VITALS — BP 126/70 | HR 60 | Temp 97.5°F | Ht 62.0 in | Wt 114.8 lb

## 2010-09-28 DIAGNOSIS — J449 Chronic obstructive pulmonary disease, unspecified: Secondary | ICD-10-CM

## 2010-09-28 MED ORDER — PREDNISONE (PAK) 10 MG PO TABS: ORAL_TABLET | ORAL | Status: AC

## 2010-09-28 NOTE — Patient Instructions (Addendum)
Work on Chemical engineer technique:  relax and gently blow all the way out then take a nice smooth deep breath back in, triggering the inhaler at same time you start breathing in.  Hold for up to 5 seconds if you can.  Rinse and gargle with water when done   If your mouth or throat starts to bother you,   I suggest you time the inhaler to your dental care and after using the inhaler(s) brush teeth and tongue with a baking soda containing toothpaste and when you rinse this out, gargle with it first to see if this helps your mouth and throat.     Prednisone 10 mg take  4 each am x 2 days,   2 each am x 2 days,  1 each am x2days and stop  Take with meals  If much better then worsen within a week or two you need to return to see Tammy NP to set you up with a nebulizer  If the predisone makes no difference this is likely reflux and you will need to see Dr [name] who can discuss further treatment options with you because there is non-acid reflux that can and does effect your breathing and asthma.

## 2010-09-28 NOTE — Assessment & Plan Note (Signed)
Symptoms are markedly disproportionate to objective findings and not clear this is a lung problem but pt does appear to have difficult airway management issues.   DDX of  difficult airways managment all start with A and  include Adherence, Ace Inhibitors, Acid Reflux, Active Sinus Disease, Alpha 1 Antitripsin deficiency, Anxiety masquerading as Airways dz,  ABPA,  allergy(esp in young), Aspiration (esp in elderly), Adverse effects of DPI,  Active smokers, plus two Bs  = Bronchiectasis and Beta blocker use..and one C= CHF  Adherence is always the initial "prime suspect" and is a multilayered concern that requires a "trust but verify" approach in every patient - starting with knowing how to use medications, especially inhalers, correctly, keeping up with refills and understanding the fundamental difference between maintenance and prns vs those medications only taken for a very short course and then stopped and not refilled. The proper method of use, as well as anticipated side effects, of this metered-dose inhaler are discussed and demonstrated to the patient. Variably improved to 75% effective with coaching but failed to relax to RV and inconsistent with trigger/ smooth deep insp  ? ACid reflux > if not better with 6 days of prednisone rec gerd re-eval for non-acid component  If better p 6 days of prednisone then flare next step would be to consider neb with formoterol and budesonide

## 2010-09-28 NOTE — Progress Notes (Signed)
Subjective:     Patient ID: Kelly [facility], female   DOB: 1930-04-01, 77 y.o.   MRN: [id number]  HPI  30  yowf quit smoking in 1964 history of of COPD GOLD I severity 07/2009  March 10, 2008 ov completely back to baseline, no doe but very sedentary, no sign cough or nocturnal or early am symptoms or need for rescue rx.   Jun 24, 2008 not really but bad reflux, stopped boniva, no better. Only occ active enough to be sob with activity, no real variability or increase need for combivent.   July 07, 2009 Acute visit on lisinopril Pt c/o chest tightness and mild increase in SOB over the past month. She has been using combivent 2 x per day. She relates symptoms to stress at home. She has had some cough over the past few days- prod with minimal clear sputum.  Try off Lisinopril  Diovan 80/12.5 for at least 6 weeks  Work on inhaler technique: relax and blow all the way out then take a nice smooth deep breath back in, triggering the inhaler at same time you start breathing in  Prednisone 10 mg 4 each am x 2days, 2x2days, 1x2days and stop  Please schedule a follow-up appointment in 6 weeks, sooner if needed with PFT's and cxr on return   August 18, 2009 Followup to review PFT's. Pt states that her breathing has been doing well. She denies any complaints today. Cough has improved.   November 24, 2009--Presents for an acute office visit. Pt states was seen by Dr. [facility] and dx with thrush and treated with Majic Mouthwash. Dr. Willeen Drake found "white patches" in throat during scope and advised pt it was from using Advair. Pt has once day of Advair left. Initially had persistent sore throat that prompted her to go to ENT. Since last visit her breathing has been doing well w/ no flare in symptom cough/wheezing or dyspnea.  rec change symbicort 80 instead of advair   09/28/2010 f/u ov/[facility] cc hoarse in June > ent dx reflux > increase nex to bid ac > better  doing fine until 2-3 weeks prior to ov with sense  of chest tightness new and different > er > refer to Cardiology in [place] to have stress test  8/27.   New rx citalopram now, Sleeping ok without nocturnal  or early am exac of resp c/o's or need for noct saba.   A little more doe at times , better with combivent. No excess mucus  Pt denies any significant sore throat, dysphagia, itching, sneezing,  nasal congestion or excess/ purulent secretions,  fever, chills, sweats, unintended wt loss, pleuritic or exertional cp, hempoptysis, orthopnea pnd or leg swelling.  Also denies presyncope, palpitations, heartburn, abdominal pain, nausea, vomiting, diarrhea  or change in bowel or urinary habits, dysuria,hematuria,  rash, arthralgias, visual complaints, headache, numbness weakness or ataxia.  Also denies any obvious fluctuation of symptoms with weather or environmental changes or other aggravating or alleviating factors.     Allergies  1) ! Sulfa   Past Medical History:  Kelly [facility] (ICD-530.81)  - try off ace July 07, 2009 > symptoms resolved  C O P Drake (ICD-496)  - PFT's 12/20/04 FEV1 1.81 (107%) ratio 57, DLC0 81%, no resopnse to B2  - PFT's 08/01/09 FEV1 1.64 (104%) ratio 54 DLCO 60, corrects to 71%  - HFA 25% December 13, 2009 > 75% 09/28/2010  OSTEOPOROSIS  GERD ........................................................ [facility]     -  EGD at [facility] hernia" Jan 2012   Review of Systems     Objective:   Physical Exam    wt 128 Jun 24, 2008 > 127 July 07, 2009> 127 August 18, 2009 >>123 November 24, 2009 > 121 December 13, 2009 > 114 09/28/2010  amb pleasant wf  HEENT mild turbinate edema. Oropharynx no thrush or excess pnd or cobblestoning. No JVD or cervical adenopathy. Mild accessory muscle hypertrophy. Trachea midline, nl thryroid. Chest was hyperinflated by percussion with diminished breath sounds and moderate increased exp time without wheeze. Hoover sign positive at mid inspiration. Regular rate and rhythm without murmur gallop or rub or  increase P2 or edema. Abd: no hsm, nl excursion. Ext warm without cyanosis or clubbing Assessment:          Plan:

## 2010-12-21 ENCOUNTER — Emergency Department: Payer: Self-pay | Admitting: Emergency Medicine

## 2011-01-10 ENCOUNTER — Ambulatory Visit: Payer: Self-pay | Admitting: Internal Medicine

## 2011-01-24 ENCOUNTER — Other Ambulatory Visit: Payer: Self-pay | Admitting: Internal Medicine

## 2011-01-31 ENCOUNTER — Ambulatory Visit: Payer: Self-pay

## 2011-08-03 ENCOUNTER — Other Ambulatory Visit: Payer: Self-pay | Admitting: Internal Medicine

## 2011-09-05 ENCOUNTER — Ambulatory Visit (INDEPENDENT_AMBULATORY_CARE_PROVIDER_SITE_OTHER): Payer: Medicare Other | Admitting: Internal Medicine

## 2011-09-05 ENCOUNTER — Encounter: Payer: Self-pay | Admitting: Internal Medicine

## 2011-09-05 VITALS — BP 110/62 | HR 71 | Temp 97.5°F | Ht 60.5 in | Wt 116.4 lb

## 2011-09-05 DIAGNOSIS — J449 Chronic obstructive pulmonary disease, unspecified: Secondary | ICD-10-CM

## 2011-09-05 MED ORDER — IPRATROPIUM-ALBUTEROL 20-100 MCG/ACT IN AERS: 1.0000 | INHALATION_SPRAY | Freq: Four times a day (QID) | RESPIRATORY_TRACT | Status: DC | PRN

## 2011-09-05 NOTE — Patient Instructions (Addendum)
Change combivent to combivent respimat one every 6 hours as needed   If you are satisfied with your treatment plan let your doctor know and he/she can either refill your medications or you can return here when your prescription runs out.     If in any way you are not 100% satisfied,  please tell us.  If 100% better, tell your friends!

## 2011-09-05 NOTE — Progress Notes (Signed)
Subjective:     Patient ID: Kelly Drake, female   DOB: 1930/08/01, 78 y.o.   MRN: [id number]  HPI  68  yowf quit smoking in 1964 history of of COPD GOLD I severity 07/2009  March 10, 2008 ov completely back to baseline, no doe but very sedentary, no sign cough or nocturnal or early am symptoms or need for rescue rx.   Jun 24, 2008 not really but bad reflux, stopped boniva, no better. Only occ active enough to be sob with activity, no real variability or increase need for combivent.   July 07, 2009 Acute visit on lisinopril Pt c/o chest tightness and mild increase in SOB over the past month. She has been using combivent 2 x per day. She relates symptoms to stress at home. She has had some cough over the past few days- prod with minimal clear sputum.  Try off Lisinopril  Diovan 80/12.5 for at least 6 weeks  Work on inhaler technique: relax and blow all the way out then take a nice smooth deep breath back in, triggering the inhaler at same time you start breathing in  Prednisone 10 mg 4 each am x 2days, 2x2days, 1x2days and stop  Please schedule a follow-up appointment in 6 weeks, sooner if needed with PFT's and cxr on return   August 18, 2009 Followup to review PFT's. Pt states that her breathing has been doing well. She denies any complaints today. Cough has improved.   November 24, 2009--Presents for an acute office visit. Pt states was seen by Dr. Marion Downer ENT and dx with thrush and treated with Majic Mouthwash. Dr. Willeen Cass found "white patches" in throat during scope and advised pt it was from using Advair. Pt has once day of Advair left. Initially had persistent sore throat that prompted her to go to ENT. Since last visit her breathing has been doing well w/ no flare in symptom cough/wheezing or dyspnea.  rec change symbicort 80 instead of advair   09/28/2010 f/u ov/Kamara Allan cc hoarse in June > ent dx reflux > increase nex to bid ac > better  doing fine until 2-3 weeks prior to ov with sense  of chest tightness new and different > er > refer to Cardiology in [place] to have stress test  8/27.   New rx citalopram now, Sleeping ok without nocturnal  or early am exac of resp c/o's or need for noct saba.   A little more doe at times , better with combivent. No excess mucus rec Work on perfecting inhaler technique:   Prednisone 10 mg take  4 each am x 2 days,   2 each am x 2 days,  1 each am x2days and stop  Take with meals If much better then worsen within a week or two you need to return to see Tammy NP to set you up with a nebulizer If the predisone makes no difference this is likely reflux and you will need to see Dr [name] who can discuss further treatment options with you because there is non-acid reflux that can and does effect your breathing and asthma.    09/05/2011 f/u ov/Edmonia Gonser cc doe no change, on symbicort 160 2bid and wants to know about other options. Rare saba need daytime.  No unusual cough, purulent sputum or sinus/hb symptoms on present rx.     Sleeping ok without nocturnal  or early am exacerbation  of respiratory  c/o's or need for noct saba. Also denies any obvious fluctuation of symptoms  with weather or environmental changes or other aggravating or alleviating factors except as outlined above   Pt denies any significant sore throat, dysphagia, itching, sneezing,  nasal congestion or excess/ purulent secretions,  fever, chills, sweats, unintended wt loss, pleuritic or exertional cp, hempoptysis, orthopnea pnd or leg swelling.  Also denies presyncope, palpitations, heartburn, abdominal pain, nausea, vomiting, diarrhea  or change in bowel or urinary habits, dysuria,hematuria,  rash, arthralgias, visual complaints, headache, numbness weakness or ataxia.      Allergies  1) ! Sulfa   Past Medical History:  [name] (ICD-530.81)  - try off ace July 07, 2009 > symptoms resolved  C O P D (ICD-496)  - PFT's 12/20/04 FEV1 1.81 (107%) ratio 57, DLC0 81%, no resopnse to B2    - PFT's 08/01/09 FEV1 1.64 (104%) ratio 54 DLCO 60, corrects to 71%  - HFA 25% December 13, 2009 > 75% 09/28/2010  OSTEOPOROSIS  GERD ........................................................ Elliott     - EGD at [facility] hernia" Jan 2012       Objective:   Physical Exam    wt 128 Jun 24, 2008 > 127 July 07, 2009> 127 August 18, 2009 >>123 November 24, 2009 > 121 December 13, 2009 > 114 09/28/2010 > 09/05/2011  116 amb pleasant wf  HEENT mild turbinate edema. Oropharynx no thrush or excess pnd or cobblestoning. No JVD or cervical adenopathy. Mild accessory muscle hypertrophy. Trachea midline, nl thryroid. Chest was hyperinflated by percussion with diminished breath sounds and moderate increased exp time without wheeze. Hoover sign positive at mid inspiration. Regular rate and rhythm without murmur gallop or rub or increase P2 or edema. Abd: no hsm, nl excursion. Ext warm without cyanosis or clubbing Assessment:          Plan:

## 2011-09-06 NOTE — Assessment & Plan Note (Signed)
-   PFT's 12/20/04 FEV1 1.81 (107%) ratio 57, DLC0 81%, no resopnse to B2  - PFT's 08/01/09 FEV1 1.64 (104%) ratio 54 DLCO 60, corrects to 71%  - HFA 25% December 13, 2009 > 75% 09/28/2010   GOLD I well compensated on present rx  The proper method of use, as well as anticipated side effects, of a metered-dose inhaler are discussed and demonstrated to the patient. Improved effectiveness after extensive coaching during this visit to a level of approximately  75%    Each maintenance medication was reviewed in detail including most importantly the difference between maintenance and as needed and under what circumstances the prns are to be used.  Please see instructions for details which were reviewed in writing and the patient given a copy.

## 2011-12-07 ENCOUNTER — Other Ambulatory Visit: Payer: Self-pay | Admitting: Internal Medicine

## 2012-02-01 ENCOUNTER — Ambulatory Visit: Payer: Self-pay | Admitting: Internal Medicine

## 2012-04-01 ENCOUNTER — Ambulatory Visit: Payer: Self-pay | Admitting: Unknown Physician Specialty

## 2012-04-02 LAB — PATHOLOGY REPORT

## 2012-05-17 ENCOUNTER — Emergency Department: Payer: Self-pay | Admitting: Emergency Medicine

## 2012-08-21 ENCOUNTER — Encounter: Payer: Self-pay | Admitting: Internal Medicine

## 2012-08-21 ENCOUNTER — Ambulatory Visit (INDEPENDENT_AMBULATORY_CARE_PROVIDER_SITE_OTHER): Payer: Medicare Other | Admitting: Internal Medicine

## 2012-08-21 VITALS — BP 110/60 | HR 63 | Temp 97.8°F | Ht 60.0 in | Wt 116.0 lb

## 2012-08-21 DIAGNOSIS — J449 Chronic obstructive pulmonary disease, unspecified: Secondary | ICD-10-CM

## 2012-08-21 NOTE — Patient Instructions (Addendum)
For now continue the symbicort 80 Take 2 puffs first thing in am and then another 2 puffs about 12 hours later.   Work on inhaler technique:  relax and gently blow all the way out then take a nice smooth deep breath back in, triggering the inhaler at same time you start breathing in.  Hold for up to 5 seconds if you can.  Rinse and gargle with water when done  Only use your comibvent  as a rescue medication to be used if you can't catch your breath by resting or doing a relaxed purse lip breathing pattern. The less you use it, the better it will work when you need it.   Ok to use it up to every 6 hours  Call if get worse, otherwise return in one year

## 2012-08-21 NOTE — Assessment & Plan Note (Addendum)
-   PFT's 12/20/04 FEV1 1.81 (107%) ratio 57, DLC0 81%, no resopnse to B2  - PFT's 08/01/09 FEV1 1.64 (104%) ratio 54 DLCO 60, corrects to 71%      Adequate control on present rx :  reviewed each maintenance medication  in detail including most importantly the difference between maintenance and as needed and under what circumstances the prns are to be used.  Please see instructions for details which were reviewed in writing and the patient given a copy.   The proper method of use, as well as anticipated side effects, of a metered-dose inhaler are discussed and demonstrated to the patient. Improved effectiveness after extensive coaching during this visit to a level of approximately  50% but ok for now to continue symbicort

## 2012-08-21 NOTE — Progress Notes (Signed)
Subjective:     Patient ID: Kelly Drake, female   DOB: 1930/06/25    MRN: [id number]    Brief patient profile:  82  yowf quit smoking in 1964 history of of COPD GOLD I severity 07/2009   July 07, 2009 Acute visit on lisinopril Pt c/o chest tightness and mild increase in SOB over the past month. She has been using combivent 2 x per day. She relates symptoms to stress at home. She has had some cough over the past few days- prod with minimal clear sputum.  Try off Lisinopril  Diovan 80/12.5 for at least 6 weeks  Work on inhaler technique: relax and blow all the way out then take a nice smooth deep breath back in, triggering the inhaler at same time you start breathing in  Prednisone 10 mg 4 each am x 2days, 2x2days, 1x2days and stop  Please schedule a follow-up appointment in 6 weeks, sooner if needed with PFT's and cxr on return   August 18, 2009 Followup to review PFT's. Pt states that her breathing has been doing well. She denies any complaints today. Cough has improved.   November 24, 2009--Presents for an acute office visit. Pt states was seen by Dr. Marion Downer ENT and dx with thrush and treated with Majic Mouthwash. Dr. Willeen Cass found "white patches" in throat during scope and advised pt it was from using Advair. Pt has once day of Advair left. Initially had persistent sore throat that prompted her to go to ENT. Since last visit her breathing has been doing well w/ no flare in symptom cough/wheezing or dyspnea.  rec change symbicort 80 instead of advair   09/28/2010 f/u ov/Corderro Koloski cc hoarse in June > ent dx reflux > increase nex to bid ac > better  doing fine until 2-3 weeks prior to ov with sense of chest tightness new and different > er > refer to Cardiology in [place] to have stress test  8/27.   New rx citalopram now, Sleeping ok without nocturnal  or early am exac of resp c/o's or need for noct saba.   A little more doe at times , better with combivent. No excess mucus rec Work on  perfecting inhaler technique:   Prednisone 10 mg take  4 each am x 2 days,   2 each am x 2 days,  1 each am x2days and stop  Take with meals If much better then worsen within a week or two you need to return to see Tammy NP to set you up with a nebulizer If the predisone makes no difference this is likely reflux and you will need to see Dr [name] who can discuss further treatment options with you because there is non-acid reflux that can and does effect your breathing and asthma.    09/05/2011 f/u ov/Trevionne Advani cc doe no change, on symbicort 160 2bid and wants to know about other options. Rare saba need daytime.  No unusual cough, purulent sputum or sinus/hb symptoms on present rx. rec Change combivent to combivent respimat one every 6 hours as needed  08/21/2012 f/u ov/Yusuf Yu GOLD I COPD/ on symbicor 80 2 bid Chief Complaint  Patient presents with  . Follow-up    Pt states her breathing is stable, no new co's today.    avg combivent once daily  No desired activity limited by breathing  No obvious daytime variabilty or assoc chronic cough or cp or chest tightness, subjective wheeze overt sinus or hb symptoms. No unusual exp hx or  h/o childhood pna/ asthma or knowledge of premature birth.   Sleeping ok without nocturnal  or early am exacerbation  of respiratory  c/o's or need for noct saba. Also denies any obvious fluctuation of symptoms with weather or environmental changes or other aggravating or alleviating factors except as outlined above   Current Medications, Allergies, Complete Past Medical History, Past Surgical History, Family History, and Social History were reviewed in [employer] record.  ROS  The following are not active complaints unless bolded sore throat, dysphagia, dental problems, itching, sneezing,  nasal congestion or excess/ purulent secretions, ear ache,   fever, chills, sweats, unintended wt loss, pleuritic or exertional cp, hemoptysis,  orthopnea pnd or  leg swelling, presyncope, palpitations, heartburn, abdominal pain, anorexia, nausea, vomiting, diarrhea  or change in bowel or urinary habits, change in stools or urine, dysuria,hematuria,  rash, arthralgias, visual complaints, headache, numbness weakness or ataxia or problems with walking or coordination,  change in mood/affect or memory.              .      Past Medical History:  G E R D (ICD-530.81)  - try off ace July 07, 2009 > symptoms resolved  C O P D (ICD-496)  - PFT's 12/20/04 FEV1 1.81 (107%) ratio 57, DLC0 81%, no resopnse to B2  - PFT's 08/01/09 FEV1 1.64 (104%) ratio 54 DLCO 60, corrects to 71%  - HFA 25% December 13, 2009 > 75% 09/28/2010  OSTEOPOROSIS  GERD ........................................................ Elliott     - EGD at [facility] hernia" Jan 2012       Objective:   Physical Exam    wt 128 Jun 24, 2008 > 127 July 07, 2009> 127 August 18, 2009 >>123 November 24, 2009 > 121 December 13, 2009 > 114 09/28/2010 > 09/05/2011  116 > 08/21/2012  116  amb pleasant wf  HEENT mild turbinate edema. Oropharynx no thrush or excess pnd or cobblestoning. No JVD or cervical adenopathy. Mild accessory muscle hypertrophy. Trachea midline, nl thryroid. Chest was hyperinflated by percussion with diminished breath sounds and moderate increased exp time without wheeze. Hoover sign positive at mid inspiration. Regular rate and rhythm without murmur gallop or rub or increase P2 or edema. Abd: no hsm, nl excursion. Ext warm without cyanosis or clubbing    Assessment:

## 2012-08-29 ENCOUNTER — Ambulatory Visit: Payer: Medicare Other | Admitting: Internal Medicine

## 2012-12-10 ENCOUNTER — Ambulatory Visit: Payer: Self-pay | Admitting: Internal Medicine

## 2012-12-31 ENCOUNTER — Ambulatory Visit (INDEPENDENT_AMBULATORY_CARE_PROVIDER_SITE_OTHER): Payer: Medicare Other | Admitting: Internal Medicine

## 2012-12-31 ENCOUNTER — Encounter: Payer: Self-pay | Admitting: Internal Medicine

## 2012-12-31 VITALS — BP 104/60 | HR 70 | Temp 98.1°F | Ht 60.0 in | Wt 115.6 lb

## 2012-12-31 DIAGNOSIS — J449 Chronic obstructive pulmonary disease, unspecified: Secondary | ICD-10-CM

## 2012-12-31 DIAGNOSIS — R079 Chest pain, unspecified: Secondary | ICD-10-CM

## 2012-12-31 MED ORDER — TIOTROPIUM BROMIDE MONOHYDRATE 18 MCG IN CAPS: ORAL_CAPSULE | RESPIRATORY_TRACT | Status: DC

## 2012-12-31 NOTE — Progress Notes (Signed)
Subjective:     Patient ID: Kelly Drake, female   DOB: September 05, 1930    MRN: [id number]    Brief patient profile:  82  yowf quit smoking in 1964 history of of COPD GOLD I severity 07/2009   July 07, 2009 Acute visit on lisinopril Pt c/o chest tightness and mild increase in SOB over the past month. She has been using combivent 2 x per day. She relates symptoms to stress at home. She has had some cough over the past few days- prod with minimal clear sputum.  Try off Lisinopril  Diovan 80/12.5 for at least 6 weeks  Work on inhaler technique: relax and blow all the way out then take a nice smooth deep breath back in, triggering the inhaler at same time you start breathing in  Prednisone 10 mg 4 each am x 2days, 2x2days, 1x2days and stop  Please schedule a follow-up appointment in 6 weeks, sooner if needed with PFT's and cxr on return   August 18, 2009 Followup to review PFT's. Pt states that her breathing has been doing well. She denies any complaints today. Cough has improved.   November 24, 2009--Presents for an acute office visit. Pt states was seen by Dr. Marion Downer ENT and dx with thrush and treated with Majic Mouthwash. Dr. Willeen Cass found "white patches" in throat during scope and advised pt it was from using Advair. Pt has once day of Advair left. Initially had persistent sore throat that prompted her to go to ENT. Since last visit her breathing has been doing well w/ no flare in symptom cough/wheezing or dyspnea.  rec change symbicort 80 instead of advair   09/28/2010 f/u ov/Kodee Ravert cc hoarse in June > ent dx reflux > increase nex to bid ac > better  doing fine until 2-3 weeks prior to ov with sense of chest tightness new and different > er > refer to Cardiology in [place] to have stress test  8/27.   New rx citalopram now, Sleeping ok without nocturnal  or early am exac of resp c/o's or need for noct saba.   A little more doe at times , better with combivent. No excess mucus rec Work on  perfecting inhaler technique:   Prednisone 10 mg take  4 each am x 2 days,   2 each am x 2 days,  1 each am x2days and stop  Take with meals If much better then worsen within a week or two you need to return to see Tammy NP to set you up with a nebulizer If the predisone makes no difference this is likely reflux and you will need to see Dr [name] who can discuss further treatment options with you because there is non-acid reflux that can and does effect your breathing and asthma.    09/05/2011 f/u ov/Arriyanna Mersch cc doe no change, on symbicort 160 2bid and wants to know about other options. Rare saba need daytime.  No unusual cough, purulent sputum or sinus/hb symptoms on present rx. rec Change combivent to combivent respimat one every 6 hours as needed  08/21/2012 f/u ov/Yehudah Standing GOLD I COPD/ on symbicor 80 2 bid Chief Complaint  Patient presents with  . Follow-up    Pt states her breathing is stable, no new co's today.    avg combivent once daily  No desired activity limited by breathing rec For now continue the symbicort 80 Take 2 puffs first thing in am and then another 2 puffs about 12 hours later.  Work on  inhaler technique:    Only use your comibvent  as a rescue medication    Ok to use it up to every 6 hours Call if get worse, otherwise return in one year    12/31/2012 f/u ov/Falcon Mccaskey re: new chest heaviness / always at end of exertion, no pleuritic features, no n or v or rad to jaw or arm, no diaphoresis and never at rest or sleeping Chief Complaint  Patient presents with  . Acute Visit    Pt c/o heavy feeling in her chest with exertion for the past couple wks. No change in her breathing. She has used combivent for resue 1-2 times per day the past wk.   def feels better p combivent and can repeat ex s cp p she uses it though hfa very poor (see a/p) nexium   at 40 mg Take 30- 60 min before  first and last meals of the day   No obvious daytime variabilty or assoc chronic cough or subjective  wheeze overt sinus or hb symptoms. No unusual exp hx or h/o childhood pna/ asthma or knowledge of premature birth.   Sleeping ok without nocturnal  or early am exacerbation  of respiratory  c/o's or need for noct saba. Also denies any obvious fluctuation of symptoms with weather or environmental changes or other aggravating or alleviating factors except as outlined above   Current Medications, Allergies, Complete Past Medical History, Past Surgical History, Family History, and Social History were reviewed in [employer] record.  ROS  The following are not active complaints unless bolded sore throat, dysphagia, dental problems, itching, sneezing,  nasal congestion or excess/ purulent secretions, ear ache,   fever, chills, sweats, unintended wt loss, pleuritic or exertional cp, hemoptysis,  orthopnea pnd or leg swelling, presyncope, palpitations, heartburn, abdominal pain, anorexia, nausea, vomiting, diarrhea  or change in bowel or urinary habits, change in stools or urine, dysuria,hematuria,  rash, arthralgias, visual complaints, headache, numbness weakness or ataxia or problems with walking or coordination,  change in mood/affect or memory.         Past Medical History:  [name] (ICD-530.81)  - try off ace July 07, 2009 > symptoms resolved  C O P D (ICD-496)  - PFT's 12/20/04 FEV1 1.81 (107%) ratio 57, DLC0 81%, no resopnse to B2  - PFT's 08/01/09 FEV1 1.64 (104%) ratio 54 DLCO 60, corrects to 71%  - HFA 25% December 13, 2009 > 75% 09/28/2010  OSTEOPOROSIS  GERD ........................................................ Elliott     - EGD at [facility] hernia" Jan 2012       Objective:   Physical Exam    wt 128 Jun 24, 2008 > 127 July 07, 2009> 127 August 18, 2009 >>123 November 24, 2009 > 121 December 13, 2009 > 114 09/28/2010 > 09/05/2011  116 > 08/21/2012  116 > 12/31/2012  116  amb pleasant wf  HEENT mild turbinate edema. Oropharynx no thrush or excess pnd or  cobblestoning. No JVD or cervical adenopathy. Mild accessory muscle hypertrophy. Trachea midline, nl thryroid. Chest was hyperinflated by percussion with diminished breath sounds and moderate increased exp time without wheeze. Hoover sign positive at mid inspiration. Regular rate and rhythm without murmur gallop or rub or increase P2 or edema. Abd: no hsm, nl excursion. Ext warm without cyanosis or clubbing    Assessment:

## 2012-12-31 NOTE — Patient Instructions (Addendum)
For now continue the symbicort 80 Take 2 puffs first thing in am and then another 2 puffs about 12 hours later and add spiriva one capsule each am only   Work on inhaler technique:  relax and gently blow all the way out then take a nice smooth deep breath back in, triggering the inhaler at same time you start breathing in.  Hold for up to 5 seconds if you can.  Rinse and gargle with water when done  Only use your comibvent  as a rescue medication to be used if you can't catch your breath by resting or doing a relaxed purse lip breathing pattern. The less you use it, the better it will work when you need it.   Ok to use it up to every 6 hours  Let your heart doctor know  About your chest discomfort with exertion and slow down > to ER if persists   Please schedule a follow up office visit in 6 weeks, call sooner if needed

## 2013-01-01 DIAGNOSIS — R079 Chest pain, unspecified: Secondary | ICD-10-CM | POA: Insufficient documentation

## 2013-01-01 MED ORDER — METOPROLOL TARTRATE 12.5 MG HALF TABLET: 12.5000 mg | ORAL_TABLET | Freq: Two times a day (BID) | ORAL | Status: DC

## 2013-01-01 NOTE — Assessment & Plan Note (Signed)
EKG p ex c/w very mild lateral st depression 12/31/12 c/w myocardial ischemia until prove otherwise  Will add lopressor 12.5 mg bid but bp too low to support higher doses, already on asa > cardiologist is at [facility] and she was requested to let him know right away re ex cp and go to er if worsens or does not get immediate relief at rest

## 2013-01-01 NOTE — Assessment & Plan Note (Signed)
-   PFT's 12/20/04 FEV1 1.81 (107%) ratio 57, DLC0 81%, no resopnse to B2  - PFT's 08/01/09 FEV1 1.64 (104%) ratio 54 DLCO 60, corrects to 71% - 12/31/2012   Walked RA x one lap @ 185 stopped due to  Cp, sats still 99%   - HFA 50% 12/31/2012  - spiriva trial 12/31/2012   The proper method of use, as well as anticipated side effects, of a metered-dose inhaler are discussed and demonstrated to the patient. Improved effectiveness after extensive coaching during this visit to a level of approximately  25% so started spiriva and able to get 100% effectively delivered - so much better that likely will be able to stop symbicort after 10 days triple combo rx     Each maintenance medication was reviewed in detail including most importantly the difference between maintenance and as needed and under what circumstances the prns are to be used.  Please see instructions for details which were reviewed in writing and the patient given a copy.

## 2013-01-02 ENCOUNTER — Telehealth: Payer: Self-pay | Admitting: Internal Medicine

## 2013-01-02 ENCOUNTER — Other Ambulatory Visit: Payer: Self-pay | Admitting: Internal Medicine

## 2013-01-02 ENCOUNTER — Telehealth: Payer: Self-pay | Admitting: *Deleted

## 2013-01-02 MED ORDER — METOPROLOL TARTRATE 12.5 MG HALF TABLET: 12.5000 mg | ORAL_TABLET | Freq: Two times a day (BID) | ORAL | Status: DC

## 2013-01-02 NOTE — Telephone Encounter (Signed)
I spoke with pt. She reports she was not told if she needed to stop the tribenzor since sarting metoprolol. Please advise MW thanks

## 2013-01-02 NOTE — Telephone Encounter (Signed)
Spoke with the pt and notified of recs per MW She verbalized understanding  Note faxed Dr. Madison Hickman at [facility] in [place] Rx sent to pharm  Pt to call back if needs help with getting appt

## 2013-01-02 NOTE — Telephone Encounter (Signed)
Pt returned call.  Holly D Pryor ° °

## 2013-01-02 NOTE — Telephone Encounter (Signed)
lmtcb x1 for pt. 

## 2013-01-02 NOTE — Telephone Encounter (Signed)
i called and made pt aware. Nothing further needed 

## 2013-01-02 NOTE — Telephone Encounter (Signed)
Message copied by Christen Butter on Fri Jan 02, 2013  9:02 AM ------      Message from: Sandrea Hughs B      Created: Thu Jan 01, 2013  7:07 AM       Let her know p chart review best to start now lopressor 12.5 mg bid and see her cardiologist w/in the next week - let us know if needs [place] to call him - also get his name and fax ov 11/26 to him ------

## 2013-01-02 NOTE — Telephone Encounter (Signed)
Sorry for confusion - the lopressor in this dose is not going to work for her HBP- it's just a precautionary way to help reduce heart work until she has this further evaluated so she she add the lopressor to what she already takes

## 2013-02-04 ENCOUNTER — Ambulatory Visit: Payer: Self-pay | Admitting: Internal Medicine

## 2013-02-10 ENCOUNTER — Ambulatory Visit: Payer: Self-pay | Admitting: Internal Medicine

## 2013-02-11 ENCOUNTER — Ambulatory Visit (INDEPENDENT_AMBULATORY_CARE_PROVIDER_SITE_OTHER): Payer: Medicare Other | Admitting: Internal Medicine

## 2013-02-11 ENCOUNTER — Encounter: Payer: Self-pay | Admitting: Internal Medicine

## 2013-02-11 VITALS — BP 120/66 | HR 60 | Temp 97.5°F | Ht 60.0 in | Wt 115.2 lb

## 2013-02-11 DIAGNOSIS — J449 Chronic obstructive pulmonary disease, unspecified: Secondary | ICD-10-CM

## 2013-02-11 DIAGNOSIS — J4489 Other specified chronic obstructive pulmonary disease: Secondary | ICD-10-CM

## 2013-02-11 NOTE — Progress Notes (Signed)
Subjective:     Patient ID: Kelly Drake, female   DOB: 07-19-1930    MRN: [id number]    Brief patient profile:  82  yowf quit smoking in 1964 history of of COPD GOLD I severity 07/2009  History of Present Illness  July 07, 2009 Acute visit on lisinopril Pt c/o chest tightness and mild increase in SOB over the past month. She has been using combivent 2 x per day. She relates symptoms to stress at home. She has had some cough over the past few days- prod with minimal clear sputum.  Try off Lisinopril  Diovan 80/12.5 for at least 6 weeks  Work on inhaler technique: relax and blow all the way out then take a nice smooth deep breath back in, triggering the inhaler at same time you start breathing in  Prednisone 10 mg 4 each am x 2days, 2x2days, 1x2days and stop  Please schedule a follow-up appointment in 6 weeks, sooner if needed with PFT's and cxr on return   August 18, 2009 Followup to review PFT's. Pt states that her breathing has been doing well. She denies any complaints today. Cough has improved.   November 24, 2009--Presents for an acute office visit. Pt states was seen by Dr. Malon Kindle ENT and dx with thrush and treated with Majic Mouthwash. Dr. Richardson Landry found "white patches" in throat during scope and advised pt it was from using Advair. Pt has once day of Advair left. Initially had persistent sore throat that prompted her to go to ENT. Since last visit her breathing has been doing well w/ no flare in symptom cough/wheezing or dyspnea.  rec change symbicort 80 instead of advair   09/28/2010 f/u ov/Lounell Schumacher cc hoarse in June > ent dx reflux > increase nex to bid ac > better  doing fine until 2-3 weeks prior to ov with sense of chest tightness new and different > er > refer to Cardiology in [place] to have stress test  8/27.   New rx citalopram now, Sleeping ok without nocturnal  or early am exac of resp c/o's or need for noct saba.   A little more doe at times , better with combivent. No excess  mucus rec Work on perfecting inhaler technique:   Prednisone 10 mg take  4 each am x 2 days,   2 each am x 2 days,  1 each am x2days and stop  Take with meals If much better then worsen within a week or two you need to return to see Tammy NP to set you up with a nebulizer If the predisone makes no difference this is likely reflux and you will need to see Dr Vira Agar who can discuss further treatment options with you because there is non-acid reflux that can and does effect your breathing and asthma.    09/05/2011 f/u ov/Anahis Furgeson cc doe no change, on symbicort 160 2bid and wants to know about other options. Rare saba need daytime.  No unusual cough, purulent sputum or sinus/hb symptoms on present rx. rec Change combivent to combivent respimat one every 6 hours as needed  08/21/2012 f/u ov/Jakaiya Netherland GOLD I COPD/ on symbicor 80 2 bid Chief Complaint  Patient presents with  . Follow-up    Pt states her breathing is stable, no new co's today.    avg combivent once daily  No desired activity limited by breathing rec For now continue the symbicort 80 Take 2 puffs first thing in am and then another 2 puffs about 12 hours  later.  Work on inhaler technique:    Only use your comibvent  as a rescue medication    Ok to use it up to every 6 hours Call if get worse, otherwise return in one year    12/31/2012 f/u ov/Kimoni Pickerill re: new chest heaviness / always at end of exertion, no pleuritic features, no n or v or rad to jaw or arm, no diaphoresis and never at rest or sleeping Chief Complaint  Patient presents with  . Acute Visit    Pt c/o heavy feeling in her chest with exertion for the past couple wks. No change in her breathing. She has used combivent for resue 1-2 times per day the past wk.   def feels better p combivent and can repeat ex s cp p she uses it though hfa very poor (see a/p) rec   For now continue the symbicort 80 Take 2 puffs first thing in am and then another 2 puffs about 12 hours later and add  spiriva one capsule each am only  Work on inhaler technique: e Only use your comibvent  as a rescue medication  Let your heart doctor know  About your chest discomfort with exertion and slow down > to ER if persists       02/11/2013 f/u ov/Rebecka Oelkers re: GOLD I copd/ no further ex chest tightness Chief Complaint  Patient presents with  . Follow-up    Pt states her breathing has improved since last visit. The heaviness in chest has resolved. No new co's today.      Not limited from desired activities    No obvious daytime variabilty or assoc chronic cough or subjective wheeze overt sinus or hb symptoms. No unusual exp hx or h/o childhood pna/ asthma or knowledge of premature birth.   Sleeping ok without nocturnal  or early am exacerbation  of respiratory  c/o's or need for noct saba. Also denies any obvious fluctuation of symptoms with weather or environmental changes or other aggravating or alleviating factors except as outlined above   Current Medications, Allergies, Complete Past Medical History, Past Surgical History, Family History, and Social History were reviewed in [employer] record.  ROS  The following are not active complaints unless bolded sore throat, dysphagia, dental problems, itching, sneezing,  nasal congestion or excess/ purulent secretions, ear ache,   fever, chills, sweats, unintended wt loss, pleuritic or exertional cp, hemoptysis,  orthopnea pnd or leg swelling, presyncope, palpitations, heartburn, abdominal pain, anorexia, nausea, vomiting, diarrhea  or change in bowel or urinary habits, change in stools or urine, dysuria,hematuria,  rash, arthralgias, visual complaints, headache, numbness weakness or ataxia or problems with walking or coordination,  change in mood/affect or memory.         Past Medical History:  Marcy Salvo D (ICD-530.81)  - try off ace July 07, 2009 > symptoms resolved  C O P D (ICD-496)  - PFT's 12/20/04 FEV1 1.81 (107%) ratio 57,  DLC0 81%, no resopnse to B2  - PFT's 08/01/09 FEV1 1.64 (104%) ratio 54 DLCO 60, corrects to 71%  - HFA 25% December 13, 2009 > 75% 09/28/2010  OSTEOPOROSIS  GERD ........................................................ Elliott     - EGD at [facility] hernia" Jan 2012       Objective:   Physical Exam    wt 128 Jun 24, 2008 > 127 July 07, 2009> 127 August 18, 2009 >>123 November 24, 2009 > 121 December 13, 2009 > 114 09/28/2010 > 09/05/2011  116 > 08/21/2012  116 > 12/31/2012  116 > 115 02/11/2013  amb pleasant wf  HEENT mild turbinate edema. Oropharynx no thrush or excess pnd or cobblestoning. No JVD or cervical adenopathy. Mild accessory muscle hypertrophy. Trachea midline, nl thryroid. Chest was hyperinflated by percussion with diminished breath sounds and moderate increased exp time without wheeze. Hoover sign positive at mid inspiration. Regular rate and rhythm without murmur gallop or rub or increase P2 or edema. Abd: no hsm, nl excursion. Ext warm without cyanosis or clubbing    Assessment:

## 2013-02-11 NOTE — Patient Instructions (Addendum)
Ok to try taper off symbicort   Continue spiriva daily each am  When you need to refill combivent, let us know and we'll change it to proair    If you are satisfied with your treatment plan let your doctor know and he/she can either refill your medications or you can return here when your prescription runs out.     If in any way you are not 100% satisfied,  please tell us.  If 100% better, tell your friends!

## 2013-02-12 NOTE — Assessment & Plan Note (Signed)
-   PFT's 12/20/04 FEV1 1.81 (107%) ratio 57, DLC0 81%, no resopnse to B2  - PFT's 08/01/09 FEV1 1.64 (104%) ratio 54 DLCO 60, corrects to 71% - 12/31/2012   Walked RA x one lap @ 185 stopped due to  Cp, sats still 99%   - HFA 50% 12/31/2012  - spiriva trial 12/31/2012   She should be fine on spiriva daily and just use the symbicort in event of exacerbations, reviewed    Each maintenance medication was reviewed in detail including most importantly the difference between maintenance and as needed and under what circumstances the prns are to be used.  Please see instructions for details which were reviewed in writing and the patient given a copy.

## 2013-03-26 DIAGNOSIS — Z8603 Personal history of neoplasm of uncertain behavior: Secondary | ICD-10-CM | POA: Insufficient documentation

## 2013-07-26 DIAGNOSIS — E538 Deficiency of other specified B group vitamins: Secondary | ICD-10-CM | POA: Insufficient documentation

## 2013-09-17 ENCOUNTER — Telehealth: Payer: Self-pay | Admitting: Internal Medicine

## 2013-09-17 MED ORDER — ALBUTEROL SULFATE HFA 108 (90 BASE) MCG/ACT IN AERS: 2.0000 | INHALATION_SPRAY | Freq: Four times a day (QID) | RESPIRATORY_TRACT | Status: DC | PRN

## 2013-09-17 NOTE — Telephone Encounter (Signed)
Rx for proair was sent to pharm  Pt aware  Nothing further needed

## 2013-10-15 ENCOUNTER — Ambulatory Visit (INDEPENDENT_AMBULATORY_CARE_PROVIDER_SITE_OTHER): Payer: Medicare Other | Admitting: Internal Medicine

## 2013-10-15 ENCOUNTER — Encounter: Payer: Self-pay | Admitting: Internal Medicine

## 2013-10-15 ENCOUNTER — Ambulatory Visit (INDEPENDENT_AMBULATORY_CARE_PROVIDER_SITE_OTHER)
Admission: RE | Admit: 2013-10-15 | Discharge: 2013-10-15 | Disposition: A | Discharge status: Home / Self Care | Payer: Medicare Other | Source: Ambulatory Visit | Attending: Internal Medicine | Admitting: Internal Medicine

## 2013-10-15 VITALS — BP 110/60 | HR 60 | Temp 97.7°F | Ht 60.0 in | Wt 106.0 lb

## 2013-10-15 DIAGNOSIS — J449 Chronic obstructive pulmonary disease, unspecified: Secondary | ICD-10-CM

## 2013-10-15 DIAGNOSIS — Z23 Encounter for immunization: Secondary | ICD-10-CM

## 2013-10-15 NOTE — Assessment & Plan Note (Signed)
-   PFT's 12/20/04 FEV1 1.81 (107%) ratio 57, DLC0 81%, no resopnse to B2  - PFT's 08/01/09 FEV1 1.64 (104%) ratio 54 DLCO 60, corrects to 71% - 12/31/2012   Walked RA x one lap @ 185 stopped due to  Cp, sats still 99%   - HFA 50% 12/31/2012  - spiriva trial 12/31/2012  - 10/15/2013  Walked RA @ mod pace x 3 laps @ 185 ft each stopped due to  End of study, no sob or desat   Symptoms are markedly disproportionate to objective findings and not clear this is a lung problem but pt does appear to have difficult airway management issues. DDX of  difficult airways management all start with A and  include Adherence, Ace Inhibitors, Acid Reflux, Active Sinus Disease, Alpha 1 Antitripsin deficiency, Anxiety masquerading as Airways dz,  ABPA,  allergy(esp in young), Aspiration (esp in elderly), Adverse effects of DPI,  Active smokers, plus two Bs  = Bronchiectasis and Beta blocker use..and one C= CHF  Adherence is always the initial "prime suspect" and is a multilayered concern that requires a "trust but verify" approach in every patient - starting with knowing how to use medications, especially inhalers, correctly, keeping up with refills and understanding the fundamental difference between maintenance and prns vs those medications only taken for a very short course and then stopped and not refilled.  - confused with names of meds/ reviewed line by line - would like to change to spiriva respimat but no samples available > will change next ov if not better - reviewed guidelines for using saba appropriately  ? Acid (or non-acid) GERD > always difficult to exclude as up to 75% of pts in some series report no assoc GI/ Heartburn symptoms> rec continue max (24h)  acid suppression and diet restrictions/ reviewed     ? chf > doubt based on absence of orthopnea or pedal edema  ? Anxiety > note effects on lung volumes from airflow obst and panic are syndergistic > worse hyperinflation> reassured she was fine p walk today

## 2013-10-15 NOTE — Progress Notes (Addendum)
Subjective:     Patient ID: Kelly Drake, female   DOB: 10-28-1930    MRN: [id number]    Brief patient profile:  83  yowf quit smoking in 1964 history of of COPD GOLD I severity 07/2009    History of Present Illness  July 07, 2009 Acute visit on lisinopril Pt c/o chest tightness and mild increase in SOB over the past month. She has been using combivent 2 x per day. She relates symptoms to stress at home. She has had some cough over the past few days- prod with minimal clear sputum.  Try off Lisinopril > improved  Diovan 80/12.5    Work on inhaler technique:   Prednisone 10 mg 4 each am x 2days, 2x2days, 1x2days and stop      09/05/2011 f/u ov/Luverne Zerkle cc doe no change, on symbicort 160 2bid and wants to know about other options. Rare saba need daytime.  No unusual cough, purulent sputum or sinus/hb symptoms on present rx. rec Change combivent to combivent respimat one every 6 hours as needed      02/11/2013 f/u ov/Elain Wixon re: GOLD I copd/ no further ex chest tightness Chief Complaint  Patient presents with  . Follow-up    Pt states her breathing has improved since last visit. The heaviness in chest has resolved. No new co's today.   Not limited from desired activities  rec Ok to try taper off symbicort  Continue spiriva daily each am When you need to refill combivent, let us know and we'll change it to proair> done    10/15/2013 f/u ov/Allan Minotti re: unexplained sob  Chief Complaint  Patient presents with  . Acute Visit    Pt c/o increased DOE for the past wk- she notices this when she gets in a hurry or she is anxious. For the past wk she has been using proair 2-3 times per day.   Doe x  Across parking lot  No sob at night nor increase cough, some better with saba  Was doing better on spiriva but still avg saba twice daily before present flare and has not used more since sob Dry powder from spriva irritating mouth and throat dry    No obvious daytime variabilty or assoc chronic cough or  cp or chest tightness or subjective wheeze overt sinus or hb symptoms. No unusual exp hx or h/o childhood pna/ asthma or knowledge of premature birth.   Sleeping ok without nocturnal  or early am exacerbation  of respiratory  c/o's or need for noct saba. Also denies any obvious fluctuation of symptoms with weather or environmental changes or other aggravating or alleviating factors except as outlined above   Current Medications, Allergies, Complete Past Medical History, Past Surgical History, Family History, and Social History were reviewed in [employer] record.  ROS  The following are not active complaints unless bolded sore throat, dysphagia, dental problems, itching, sneezing,  nasal congestion or excess/ purulent secretions, ear ache,   fever, chills, sweats, unintended wt loss, pleuritic or exertional cp, hemoptysis,  orthopnea pnd or leg swelling, presyncope, palpitations, heartburn, abdominal pain, anorexia, nausea, vomiting, diarrhea  or change in bowel or urinary habits, change in stools or urine, dysuria,hematuria,  rash, arthralgias, visual complaints, headache, numbness weakness or ataxia or problems with walking or coordination,  change in mood/affect or memory.         Past Medical History:  Marcy Salvo D (ICD-530.81)  - try off ace July 07, 2009 > symptoms resolved  C O P D (ICD-496)  - PFT's 12/20/04 FEV1 1.81 (107%) ratio 57, DLC0 81%, no resopnse to B2  - PFT's 08/01/09 FEV1 1.64 (104%) ratio 54 DLCO 60, corrects to 71%  - HFA 25% December 13, 2009 > 75% 09/28/2010  OSTEOPOROSIS  GERD ........................................................ Elliott     - EGD at [facility] hernia" Jan 2012       Objective:   Physical Exam    wt 128 Jun 24, 2008 > 127 July 07, 2009> 127 August 18, 2009 >>123 November 24, 2009 > 121 December 13, 2009 > 114 09/28/2010 > 09/05/2011  116 > 08/21/2012  116 > 12/31/2012  116 > 115 02/11/2013 > 10/15/2013  107    amb pleasant wf   HEENT mild turbinate edema. Oropharynx no thrush or excess pnd or cobblestoning. No JVD or cervical adenopathy. Mild accessory muscle hypertrophy. Trachea midline, nl thryroid. Chest was hyperinflated by percussion with diminished breath sounds and moderate increased exp time without wheeze. Hoover sign positive at mid inspiration. Regular rate and rhythm without murmur gallop or rub or increase P2 or edema. Abd: no hsm, nl excursion. Ext warm without cyanosis or clubbing  CXR  10/15/2013 : No acute cardiopulmonary disease.  COPD.  Moderate size hiatal hernia unchanged.    Assessment:     Outpatient Encounter Prescriptions as of 10/15/2013  Medication Sig  . albuterol (PROAIR HFA) 108 (90 BASE) MCG/ACT inhaler Inhale 2 puffs into the lungs every 6 (six) hours as needed for wheezing or shortness of breath.  . Calcium Carbonate-Vitamin D (CALCIUM 600+D) 600-400 MG-UNIT per tablet Take 2 tablets by mouth daily.   . cholecalciferol (VITAMIN D) 1000 UNITS tablet Take 1,000 Units by mouth daily.    Marland Kitchen esomeprazole (NEXIUM) 40 MG capsule Take 40 mg by mouth daily before breakfast.   . fluticasone (FLONASE) 50 MCG/ACT nasal spray Place 2 sprays into the nose daily.    . Iron TABS Take 1 tablet by mouth 2 (two) times a week.   . Olmesartan-Amlodipine-HCTZ (TRIBENZOR) 20-5-12.5 MG TABS Take 1 tablet by mouth daily.  . ranitidine (ZANTAC) 150 MG tablet Take 150 mg by mouth at bedtime.  Marland Kitchen tiotropium (SPIRIVA HANDIHALER) 18 MCG inhalation capsule One capsule each am  . [DISCONTINUED] aspirin 81 MG tablet Take 81 mg by mouth daily.    . [DISCONTINUED] Ipratropium-Albuterol (COMBIVENT RESPIMAT) 20-100 MCG/ACT AERS respimat Inhale 1 puff into the lungs every 6 (six) hours as needed for wheezing.  . [DISCONTINUED] SYMBICORT 80-4.5 MCG/ACT inhaler INHALE 2 PUFFS FIRST THING IN THE MORNING AND 2 PUFFS AGAIN IN THE EVENING

## 2013-10-15 NOTE — Patient Instructions (Addendum)
Please remember to go to the   x-ray department downstairs for your tests - we will call you with the results when they are available.  Only use your albuterol (proair) as a rescue medication to be used if you can't catch your breath by resting or doing a relaxed purse lip breathing pattern.  - The less you use it, the better it will work when you need it. - Ok to use up to 2 puffs  every 4 hours if you must but call for immediate appointment if use goes up over your usual need - Don't leave home without it !!  (think of it like the spare tire for your car)     If not improving return with all inhalers in hand and we'll try a new form of spiriva or another alternative

## 2013-10-16 NOTE — Progress Notes (Signed)
Quick Note:  Spoke with pt and notified of results per Dr. Wert. Pt verbalized understanding and denied any questions.  ______ 

## 2013-10-23 ENCOUNTER — Ambulatory Visit: Payer: Self-pay | Admitting: Unknown Physician Specialty

## 2013-11-10 ENCOUNTER — Telehealth: Payer: Self-pay | Admitting: Internal Medicine

## 2013-11-10 NOTE — Telephone Encounter (Signed)
Pt states she doesn't believe she had a full dose of spiriva.  Used proair.  Would like to make sure she is ok.  Satira Anis

## 2013-11-10 NOTE — Telephone Encounter (Signed)
Called and spoke with pt and she stated that she took her spiriva this morning and later on she had to use her albuterol inhaler.  Pt wanted to make sure this was ok.  I advised her this was fine.  Explained to her about the albuterol.  Nothing further is needed.

## 2013-11-22 ENCOUNTER — Emergency Department: Payer: Self-pay | Admitting: Emergency Medicine

## 2013-12-02 DIAGNOSIS — J45998 Other asthma: Secondary | ICD-10-CM | POA: Insufficient documentation

## 2013-12-02 DIAGNOSIS — M818 Other osteoporosis without current pathological fracture: Secondary | ICD-10-CM | POA: Insufficient documentation

## 2013-12-29 ENCOUNTER — Other Ambulatory Visit: Payer: Self-pay | Admitting: Internal Medicine

## 2014-01-25 ENCOUNTER — Telehealth: Payer: Self-pay | Admitting: Internal Medicine

## 2014-01-25 NOTE — Telephone Encounter (Signed)
Dr Melvyn Novas, please advise if okay with you!

## 2014-01-26 NOTE — Telephone Encounter (Signed)
atc X1, voicemail box full.  Wcb.

## 2014-01-26 NOTE — Telephone Encounter (Signed)
Fine with me- please wish her well!

## 2014-01-26 NOTE — Telephone Encounter (Signed)
Spoke with the pt and notified ok per both MD's  She states that she is having second thoughts b/c she really likes Dr Melvyn Novas She will call back and set cons with BQ if she decides she really wants to

## 2014-01-26 NOTE — Telephone Encounter (Signed)
BQ please advise if you are willing to take this pt on.  She lives closer to [place].  thanks

## 2014-01-26 NOTE — Telephone Encounter (Signed)
I'm happy to see her there

## 2014-01-27 ENCOUNTER — Ambulatory Visit: Payer: Medicare Other | Admitting: Internal Medicine

## 2014-02-02 ENCOUNTER — Ambulatory Visit: Payer: Medicare Other | Admitting: Internal Medicine

## 2014-02-12 ENCOUNTER — Ambulatory Visit: Payer: Self-pay

## 2014-04-21 DIAGNOSIS — S22000A Wedge compression fracture of unspecified thoracic vertebra, initial encounter for closed fracture: Secondary | ICD-10-CM | POA: Insufficient documentation

## 2014-06-10 ENCOUNTER — Ambulatory Visit: Payer: Medicare Other | Admitting: Adult Health

## 2014-06-17 ENCOUNTER — Ambulatory Visit: Payer: Medicare Other | Admitting: Adult Health

## 2014-07-07 ENCOUNTER — Telehealth: Payer: Self-pay | Admitting: Internal Medicine

## 2014-07-07 ENCOUNTER — Ambulatory Visit: Payer: Medicare Other | Admitting: Internal Medicine

## 2014-07-07 NOTE — Telephone Encounter (Signed)
Spoke with pt.  She would like to switch to [place] office d/t location.   She also has concerns about inhalers (spirivia and symbicort) should would like to discuss -- concerns of spiriva causing "an aftermath" of lip swelling.  She is no longer on Spiriva and was unsure when the lip swelling started - before or after stopping Spiriva, but did note it has been going on "a while."  States some days it is worse than others.  She's using proair "sparing" d/t trouble breathing "at times." Consult scheduled with BQ for tomorrow in [place] at 9:30am.  Pt aware to arrive at 9:15am and is aware to seek emergency care if needed if prior.  She verbalized understanding and voiced no further questions or concerns at this time.

## 2014-07-08 ENCOUNTER — Ambulatory Visit (INDEPENDENT_AMBULATORY_CARE_PROVIDER_SITE_OTHER): Payer: Medicare Other | Admitting: Pulmonary Disease

## 2014-07-08 ENCOUNTER — Encounter: Payer: Self-pay | Admitting: Pulmonary Disease

## 2014-07-08 VITALS — BP 130/66 | HR 65 | Ht 60.0 in | Wt 105.0 lb

## 2014-07-08 DIAGNOSIS — R220 Localized swelling, mass and lump, head: Secondary | ICD-10-CM | POA: Insufficient documentation

## 2014-07-08 DIAGNOSIS — J432 Centrilobular emphysema: Secondary | ICD-10-CM | POA: Diagnosis not present

## 2014-07-08 MED ORDER — AEROCHAMBER MV MISC: Status: AC

## 2014-07-08 NOTE — Assessment & Plan Note (Signed)
This has been a stable interval. She has excessive dry mouth with Spiriva so she switched to Symbicort. I have advised that she use Symbicort with a spacer for improved drug delivery.  Plan: Continue Symbicort 2 puffs twice a day with a spacer Consider de-escalation on the next visit considering her excellent lung function and lack of symptoms

## 2014-07-08 NOTE — Patient Instructions (Signed)
Continue Symbicort with a spacer twice a day If you notice changes in year for a sore difficulty swallowing in need to see a doctor immediately Follow-up with [place] in 6 months or sooner if needed

## 2014-07-08 NOTE — Assessment & Plan Note (Addendum)
She is complaining of increasing lip swelling over the last several months. She says that this was associated with Spiriva use so she discontinued it. She thinks that the swelling has improved slightly. On my exam I can really cannot appreciate any lip swelling. There is no inflammation. I suspect that her subjective feeling of lip swelling is related to dry mouth. Certainly this can be exacerbated by inhaled therapies.  Plan: -Advised that she continue using Biotene rinses -Continue Symbicort, but use it with a spacer -Use hard candies to help generate saliva -She was advised that should she have swelling of her throat or change in the voice that she would need to seek care with a physician immediately

## 2014-07-08 NOTE — Progress Notes (Signed)
Subjective:    Patient ID: Kelly Drake, female    DOB: 31-Mar-1930, 81 y.o.   MRN: [id number]  HPI Chief Complaint  Patient presents with  . Advice Only    Switching from MW d/t location.  Pt c/o lip swelling and dry mouth after starting spiriva Xfew months.     This is a pleasant 81 year old female who is transitioning her care for my partner to our clinic here in [place]. She previously smoked as a young adult and says that she was diagnosed with COPD several decades ago. She was previously followed by a pulmonologist here in [place] who is since left the state. She is only had 1 exacerbation of COPD to her knowledge and was treated with prednisone one time several decades ago. Since then she has been on inhaled long-acting bronchodilation or sore inhaled cortical steroid for many years. She tells me that she has minimal shortness of breath, no wheezing, and no cough. She has not had episodes of bronchitis treated by her primary care physician in many many years.  She has a history of pulmonary function testing and oxygen saturation testing as detailed below:  - PFT's 12/20/04 FEV1 1.81 (107%) ratio 57, DLC0 81%, no resopnse to B2  - PFT's 08/01/09 FEV1 1.64 (104%) ratio 54 DLCO 60, corrects to 71% - 12/31/2012 Walked RA x one lap @ 185 stopped due to Cp, sats still 99%  - 10/15/2013 Walked RA @ mod pace x 3 laps @ 185 ft each stopped due to End of study, no sob or desat   She says that for the last several months she has experienced some lip swelling. She attributed this to her Spiriva. This is been associated with chronic dry mouth which has not changed significantly lately. Specifically, the dry mouth has been persistent for years and has not worsened recently. She says that the lip swelling has improved since switching from Spiriva to Symbicort. However, it is persistent. She does not have a change in voice or throat swelling.  Past Medical History  Diagnosis Date  . GERD  (gastroesophageal reflux disease)   . COPD (chronic obstructive pulmonary disease)   . Osteoporosis      Family History  Problem Relation Age of Onset  . Emphysema Brother   . Allergies Mother      History   Social History  . Marital Status: Married    Spouse Name: N/A  . Number of Children: N/A  . Years of Education: N/A   Occupational History  . Retired    Social History Main Topics  . Smoking status: Former Smoker -- 0.50 packs/day for 3 years    Types: Cigarettes    Quit date: 02/05/1962  . Smokeless tobacco: Never Used  . Alcohol Use: No  . Drug Use: No  . Sexual Activity: Not on file   Other Topics Concern  . Not on file   Social History Narrative     Allergies  Allergen Reactions  . Sulfonamide Derivatives      Outpatient Prescriptions Prior to Visit  Medication Sig Dispense Refill  . Calcium Carbonate-Vitamin D (CALCIUM 600+D) 600-400 MG-UNIT per tablet Take 2 tablets by mouth daily.     . cholecalciferol (VITAMIN D) 1000 UNITS tablet Take 1,000 Units by mouth daily.      . fluticasone (FLONASE) 50 MCG/ACT nasal spray Place 2 sprays into the nose daily.      . Iron TABS Take 1 tablet by mouth 2 (two) times  a week.     . Olmesartan-Amlodipine-HCTZ (TRIBENZOR) 20-5-12.5 MG TABS Take 1 tablet by mouth daily.    Marland Kitchen PROAIR HFA 108 (90 BASE) MCG/ACT inhaler INHALE 2 PUFFS INTO THE LUNGS EVERY 6 HOURS AS NEEDED FOR WHEEZING ORSHORTNESS OF BREATH 8.5 g 2  . ranitidine (ZANTAC) 150 MG tablet Take 150 mg by mouth 2 (two) times daily.     Marland Kitchen esomeprazole (NEXIUM) 40 MG capsule Take 40 mg by mouth daily before breakfast.     . tiotropium (SPIRIVA HANDIHALER) 18 MCG inhalation capsule One capsule each am (Patient not taking: Reported on 07/08/2014) 30 capsule 12   No facility-administered medications prior to visit.       Review of Systems  Constitutional: Negative for fever and unexpected weight change.  HENT: Positive for trouble swallowing. Negative for  congestion, dental problem, ear pain, nosebleeds, postnasal drip, rhinorrhea, sinus pressure, sneezing and sore throat.   Eyes: Negative for redness and itching.  Respiratory: Negative for cough, chest tightness, shortness of breath and wheezing.   Cardiovascular: Negative for palpitations and leg swelling.  Gastrointestinal: Negative for nausea and vomiting.  Genitourinary: Negative for dysuria.  Musculoskeletal: Negative for joint swelling.  Skin: Negative for rash.  Neurological: Negative for headaches.  Hematological: Does not bruise/bleed easily.  Psychiatric/Behavioral: Negative for dysphoric mood. The patient is not nervous/anxious.        Objective:   Physical Exam Filed Vitals:   07/08/14 0925  BP: 130/66  Pulse: 65  Height: 5' (1.524 m)  Weight: 105 lb (47.628 kg)  SpO2: 99%   Room air  Gen: well appearing, no acute distress HENT: NCAT, OP clear, neck supple without masses Eyes: PERRL, EOMi Lymph: no cervical lymphadenopathy PULM: CTA B CV: RRR, no mgr, no JVD GI: BS+, soft, nontender, no hsm Derm: no rash or skin breakdown MSK: normal bulk and tone Neuro: A&Ox4, CN II-XII intact, strength 5/5 in all 4 extremities Psyche: normal mood and affect  September 2015 chest x-ray images personally reviewed> there is emphysema with flattening of the diaphragms bilaterally, there is a moderate size hiatal hernia  Clinic notes from my partner in 2015 were reviewed were she was cared for COPD and treated with Spiriva     Assessment & Plan:  COPD with emphysema This has been a stable interval. She has excessive dry mouth with Spiriva so she switched to Symbicort. I have advised that she use Symbicort with a spacer for improved drug delivery.  Plan: Continue Symbicort 2 puffs twice a day with a spacer Consider de-escalation on the next visit considering her excellent lung function and lack of symptoms   Swelling of both lips She is complaining of increasing lip  swelling over the last several months. She says that this was associated with Spiriva use so she discontinued it. She thinks that the swelling has improved slightly. On my exam I can really cannot appreciate any lip swelling. There is no inflammation. I suspect that her subjective feeling of lip swelling is related to dry mouth. Certainly this can be exacerbated by inhaled therapies.  Plan: -Advised that she continue using Biotene rinses -Continue Symbicort, but use it with a spacer -Use hard candies to help generate saliva -She was advised that should she have swelling of her throat or change in the voice that she would need to seek care with a physician immediately      Current outpatient prescriptions:  .  budesonide-formoterol (SYMBICORT) 80-4.5 MCG/ACT inhaler, Inhale 2 puffs into the  lungs 2 (two) times daily., Disp: , Rfl:  .  Calcium Carbonate-Vitamin D (CALCIUM 600+D) 600-400 MG-UNIT per tablet, Take 2 tablets by mouth daily. , Disp: , Rfl:  .  cholecalciferol (VITAMIN D) 1000 UNITS tablet, Take 1,000 Units by mouth daily.  , Disp: , Rfl:  .  fluticasone (FLONASE) 50 MCG/ACT nasal spray, Place 2 sprays into the nose daily.  , Disp: , Rfl:  .  Iron TABS, Take 1 tablet by mouth 2 (two) times a week. , Disp: , Rfl:  .  Olmesartan-Amlodipine-HCTZ (TRIBENZOR) 20-5-12.5 MG TABS, Take 1 tablet by mouth daily., Disp: , Rfl:  .  PROAIR HFA 108 (90 BASE) MCG/ACT inhaler, INHALE 2 PUFFS INTO THE LUNGS EVERY 6 HOURS AS NEEDED FOR WHEEZING ORSHORTNESS OF BREATH, Disp: 8.5 g, Rfl: 2 .  ranitidine (ZANTAC) 150 MG tablet, Take 150 mg by mouth 2 (two) times daily. , Disp: , Rfl:  .  telmisartan (MICARDIS) 40 MG tablet, Take 40 mg by mouth daily., Disp: , Rfl:  .  Spacer/Aero-Holding Chambers (AEROCHAMBER MV) inhaler, Use as instructed, Disp: 1 each, Rfl: 0

## 2014-09-08 DIAGNOSIS — E782 Mixed hyperlipidemia: Secondary | ICD-10-CM | POA: Insufficient documentation

## 2014-09-14 ENCOUNTER — Other Ambulatory Visit: Payer: Self-pay | Admitting: Psychiatry

## 2014-09-14 DIAGNOSIS — Z1231 Encounter for screening mammogram for malignant neoplasm of breast: Secondary | ICD-10-CM

## 2014-09-16 ENCOUNTER — Ambulatory Visit (INDEPENDENT_AMBULATORY_CARE_PROVIDER_SITE_OTHER): Payer: Medicare Other | Admitting: Obstetrics & Gynecology

## 2014-09-16 ENCOUNTER — Encounter: Payer: Self-pay | Admitting: Obstetrics & Gynecology

## 2014-09-16 VITALS — BP 121/67 | HR 72 | Ht 60.0 in | Wt 106.0 lb

## 2014-09-16 DIAGNOSIS — N8320 Unspecified ovarian cysts: Secondary | ICD-10-CM

## 2014-09-16 DIAGNOSIS — N83201 Unspecified ovarian cyst, right side: Secondary | ICD-10-CM

## 2014-09-16 DIAGNOSIS — N9089 Other specified noninflammatory disorders of vulva and perineum: Secondary | ICD-10-CM | POA: Diagnosis not present

## 2014-09-16 NOTE — Progress Notes (Signed)
   Subjective:    Patient ID: Kelly Drake, female    DOB: [birth year], 81 y.o.   MRN: [id number]  HPI  40 yo WWP1 (59 son, 2 grands) here today for follow up of a right ovarian cyst. This was discovered about 7-8 years ago. She is generally followed by Dr. Benjie Karvonen but this location is much more convenient as she lives in [place]. She has no pain, abdominal pain or change in appetite or change in stools.  Review of Systems Mammogram scheduled next week    Objective:   Physical Exam  adorable [id number] Abd- benign EG- atrophic, white lesion on both sides of vaginal introitus at bottom Bimanual exam reveals no masses  I prepped the area with betadine and infiltrated 2 cc of 1% lidocaine. I did a punch biopsy and used silver nitrate for hemostasis. She tolerated the procedure well.       Assessment & Plan:  H/o right ovarian cyst- repeat u/s and get records from [facility] Vulvar lesion- await pathology

## 2014-09-21 ENCOUNTER — Other Ambulatory Visit: Payer: Medicare Other

## 2014-09-21 ENCOUNTER — Ambulatory Visit: Admission: RE | Admit: 2014-09-21 | Payer: Medicare Other | Source: Ambulatory Visit

## 2014-09-21 ENCOUNTER — Ambulatory Visit
Admission: RE | Admit: 2014-09-21 | Discharge: 2014-09-21 | Disposition: A | Discharge status: Home / Self Care | Payer: Medicare Other | Source: Ambulatory Visit | Attending: Obstetrics & Gynecology | Admitting: Obstetrics & Gynecology

## 2014-09-21 DIAGNOSIS — N8320 Unspecified ovarian cysts: Secondary | ICD-10-CM | POA: Insufficient documentation

## 2014-09-21 DIAGNOSIS — N83201 Unspecified ovarian cyst, right side: Secondary | ICD-10-CM

## 2014-09-22 ENCOUNTER — Ambulatory Visit
Admission: RE | Admit: 2014-09-22 | Discharge: 2014-09-22 | Disposition: A | Discharge status: Home / Self Care | Payer: Medicare Other | Source: Ambulatory Visit | Attending: Psychiatry | Admitting: Psychiatry

## 2014-09-22 ENCOUNTER — Other Ambulatory Visit: Payer: Self-pay | Admitting: Internal Medicine

## 2014-09-22 DIAGNOSIS — Z1231 Encounter for screening mammogram for malignant neoplasm of breast: Secondary | ICD-10-CM | POA: Insufficient documentation

## 2014-10-01 ENCOUNTER — Telehealth: Payer: Self-pay | Admitting: *Deleted

## 2014-10-01 DIAGNOSIS — N9089 Other specified noninflammatory disorders of vulva and perineum: Secondary | ICD-10-CM

## 2014-10-01 MED ORDER — CLOBETASOL PROPIONATE 0.05 % EX OINT: TOPICAL_OINTMENT | CUTANEOUS | Status: DC

## 2014-10-01 NOTE — Telephone Encounter (Signed)
Called pt to adv path results and Clobetasol called into pharmacy. Pt expressed understanding.

## 2014-10-04 ENCOUNTER — Encounter: Payer: Self-pay | Admitting: *Deleted

## 2014-10-05 ENCOUNTER — Telehealth: Payer: Self-pay | Admitting: *Deleted

## 2014-10-05 NOTE — Telephone Encounter (Signed)
Spoke to pt about [place] results and that Dr Hulan Fray recommended a CA-125 due to the increase in size of the ovarian cyst, pt scheduled to come in on Wednesday 10-06-14 @ 1000 for blood work.

## 2014-10-06 ENCOUNTER — Other Ambulatory Visit (INDEPENDENT_AMBULATORY_CARE_PROVIDER_SITE_OTHER): Payer: Medicare Other | Admitting: *Deleted

## 2014-10-06 DIAGNOSIS — N8320 Unspecified ovarian cysts: Secondary | ICD-10-CM

## 2014-10-06 DIAGNOSIS — N83201 Unspecified ovarian cyst, right side: Secondary | ICD-10-CM

## 2014-10-07 LAB — CA 125: CA 125: 10 U/mL (ref <35)

## 2014-10-19 ENCOUNTER — Other Ambulatory Visit: Payer: Self-pay | Admitting: Internal Medicine

## 2014-12-27 ENCOUNTER — Emergency Department: Payer: Medicare Other

## 2014-12-27 ENCOUNTER — Encounter: Payer: Self-pay | Admitting: Emergency Medicine

## 2014-12-27 ENCOUNTER — Emergency Department
Admission: EM | Admit: 2014-12-27 | Discharge: 2014-12-27 | Disposition: A | Discharge status: Home / Self Care | Payer: Medicare Other | Attending: Emergency Medicine | Admitting: Emergency Medicine

## 2014-12-27 DIAGNOSIS — Y998 Other external cause status: Secondary | ICD-10-CM | POA: Insufficient documentation

## 2014-12-27 DIAGNOSIS — I10 Essential (primary) hypertension: Secondary | ICD-10-CM | POA: Insufficient documentation

## 2014-12-27 DIAGNOSIS — Z87891 Personal history of nicotine dependence: Secondary | ICD-10-CM | POA: Diagnosis not present

## 2014-12-27 DIAGNOSIS — Y9389 Activity, other specified: Secondary | ICD-10-CM | POA: Diagnosis not present

## 2014-12-27 DIAGNOSIS — W1839XA Other fall on same level, initial encounter: Secondary | ICD-10-CM | POA: Diagnosis not present

## 2014-12-27 DIAGNOSIS — Y9289 Other specified places as the place of occurrence of the external cause: Secondary | ICD-10-CM | POA: Insufficient documentation

## 2014-12-27 DIAGNOSIS — S7012XA Contusion of left thigh, initial encounter: Secondary | ICD-10-CM | POA: Diagnosis not present

## 2014-12-27 DIAGNOSIS — S7002XA Contusion of left hip, initial encounter: Secondary | ICD-10-CM

## 2014-12-27 DIAGNOSIS — Z7951 Long term (current) use of inhaled steroids: Secondary | ICD-10-CM | POA: Insufficient documentation

## 2014-12-27 DIAGNOSIS — Z79899 Other long term (current) drug therapy: Secondary | ICD-10-CM | POA: Diagnosis not present

## 2014-12-27 DIAGNOSIS — S79912A Unspecified injury of left hip, initial encounter: Secondary | ICD-10-CM | POA: Diagnosis present

## 2014-12-27 MED ORDER — TRAMADOL HCL 50 MG PO TABS: 50.0000 mg | ORAL_TABLET | Freq: Four times a day (QID) | ORAL | Status: DC | PRN

## 2014-12-27 NOTE — ED Notes (Signed)
Pt to ed with c/o left groin pain and left hip pain.  Pt states she fell about 3 weeks ago and since has had increased pain in leg groin area, worse with walking.

## 2014-12-27 NOTE — ED Provider Notes (Signed)
[facility] Emergency Department Provider Note  ____________________________________________  Time seen: Approximately 10:03 AM  I have reviewed the triage vital signs and the nursing notes.   HISTORY  Chief Complaint Fall   HPI Kelly Drake is a 81 y.o. female to a complaint of left hip pain and groin pain. Patient states she fell backwards on her buttocks approximately 3 weeks ago when she lost her footing on a curb. Patient states pain is mostly in her left hip and radiates around to her left groin at times. Patient denies any head injury or loss of consciousness. There was no difficulty with her getting up but has had increased pain in the left hip area since that time. She is continue taking Tylenol at home for the pain when she needed it. This morning she states the pain is worse. She denies any previous injury to her hip. Currently she rates her pain a 10 out of 10.Patient has continued to be ambulatory during this entire time.   Past Medical History  Diagnosis Date  . GERD (gastroesophageal reflux disease)   . COPD (chronic obstructive pulmonary disease) ([place])   . Osteoporosis     Patient Active Problem List   Diagnosis Date Noted  . Swelling of both lips 07/08/2014  . Exertional chest pain 01/01/2013  . CANDIDIASIS, ORAL 11/24/2009  . HYPERTENSION, BENIGN 06/24/2008  . COPD with emphysema ([place]) 01/06/2008  . G E R D 01/06/2008    Past Surgical History  Procedure Laterality Date  . Partial hysterectomy  1979  . Breast cyst excision  1964    Current Outpatient Rx  Name  Route  Sig  Dispense  Refill  . budesonide-formoterol (SYMBICORT) 80-4.5 MCG/ACT inhaler   Inhalation   Inhale 2 puffs into the lungs 2 (two) times daily.         . Calcium Carbonate-Vitamin D (CALCIUM 600+D) 600-400 MG-UNIT per tablet   Oral   Take 2 tablets by mouth daily.          . cholecalciferol (VITAMIN D) 1000 UNITS tablet   Oral   Take 1,000 Units by  mouth daily.           . clobetasol ointment (TEMOVATE) 0.05 %      Apply to affected area every night for 4 weeks, then every other day for 4 weeks and then twice a week for 4 weeks or until resolution.   30 g   5   . fluticasone (FLONASE) 50 MCG/ACT nasal spray   Nasal   Place 2 sprays into the nose daily.           . Iron TABS   Oral   Take 1 tablet by mouth 2 (two) times a week.          . Olmesartan-Amlodipine-HCTZ (TRIBENZOR) 20-5-12.5 MG TABS   Oral   Take 1 tablet by mouth daily.         Marland Kitchen PROAIR HFA 108 (90 BASE) MCG/ACT inhaler      INHALE 2 PUFFS INTO LUNGS EVERY 6 HOURS AS NEEDED FOR WHEEZING/SHORTNESS BREAT   8.5 g   2   . ranitidine (ZANTAC) 150 MG tablet   Oral   Take 150 mg by mouth 2 (two) times daily.          Marland Kitchen Spacer/Aero-Holding Chambers (AEROCHAMBER MV) inhaler      Use as instructed   1 each   0   . telmisartan (MICARDIS) 40 MG tablet  Oral   Take 40 mg by mouth daily.         . traMADol (ULTRAM) 50 MG tablet   Oral   Take 1 tablet (50 mg total) by mouth every 6 (six) hours as needed.   20 tablet   0     Allergies Sulfonamide derivatives  Family History  Problem Relation Age of Onset  . Emphysema Brother   . Allergies Mother     Social History Social History  Substance Use Topics  . Smoking status: Former Smoker -- 0.50 packs/day for 3 years    Types: Cigarettes    Quit date: 02/05/1962  . Smokeless tobacco: Never Used  . Alcohol Use: No    Review of Systems Constitutional: No fever/chills Eyes: No visual changes. ENT: No trauma Cardiovascular: Denies chest pain. Respiratory: Denies shortness of breath. Gastrointestinal: No abdominal pain.  No nausea, no vomiting.  Musculoskeletal: Negative for back pain. Positive left hip pain. Skin: Negative for rash. Neurological: Negative for headaches, focal weakness or numbness.  10-point ROS otherwise  negative.  ____________________________________________   PHYSICAL EXAM:  VITAL SIGNS: ED Triage Vitals  Enc Vitals Group     BP --      Pulse Rate 12/27/14 0920 63     Resp 12/27/14 0920 16     Temp 12/27/14 0920 98.2 F (36.8 C)     Temp Source 12/27/14 0920 Oral     SpO2 12/27/14 0920 95 %     Weight 12/27/14 0920 106 lb (48.081 kg)     Height 12/27/14 0920 4\' 11"  (1.499 m)     Head Cir --      Peak Flow --      Pain Score 12/27/14 0920 10     Pain Loc --      Pain Edu? --      Excl. in [place]? --     Constitutional: Alert and oriented. Well appearing and in no acute distress. Eyes: Conjunctivae are normal. PERRL. EOMI. Head: Atraumatic. Nose: No congestion/rhinnorhea. Neck: No stridor.  No cervical tenderness on palpation. Cardiovascular: Normal rate, regular rhythm. Grossly normal heart sounds.  Good peripheral circulation. Respiratory: Normal respiratory effort.  No retractions. Lungs CTAB. Gastrointestinal: Soft and nontender. No distention.  Musculoskeletal: As an outpatient left hip there is no gross deformity. Range of motion is without restriction in all planes. There is some generalized tenderness on palpation of the lateral aspect of left hip. There is no soft tissue edema noted. Examination of the back doesn't show any gross abnormality. Back is nontender on palpation. Moves lower extremities without any assistance and is ambulatory in the emergency room. Neurologic:  Normal speech and language. No gross focal neurologic deficits are appreciated. No gait instability. Skin:  Skin is warm, dry and intact. No rash noted. No abrasions or ecchymotic areas noted to the left hip,buttocks or back. Psychiatric: Mood and affect are normal. Speech and behavior are normal.  ____________________________________________   LABS (all labs ordered are listed, but only abnormal results are displayed)  Labs Reviewed - No data to display   RADIOLOGY  X-ray of left hip per  radiologist shows no acute abnormality. I, Johnn Hai, personally viewed and evaluated these images (plain radiographs) as part of my medical decision making.  ____________________________________________   PROCEDURES  Procedure(s) performed: None  Critical Care performed: No  ____________________________________________   INITIAL IMPRESSION / ASSESSMENT AND PLAN / ED COURSE  Pertinent labs & imaging results that were available during my  care of the patient were reviewed by me and considered in my medical decision making (see chart for details).  Patient was given tramadol while in the emergency room for her pain. She is also given a prescription for tramadol to be taken at home if needed for severe pain. Patient is aware that this medication may cause drowsiness which may increase her risk for falling. She is to follow-up with her doctor, Dr. Emily Filbert if any continued problems.  ____________________________________________   FINAL CLINICAL IMPRESSION(S) / ED DIAGNOSES  Final diagnoses:  Contusion of left hip and thigh, initial encounter      Johnn Hai, PA-C 12/27/14 1358  Lavonia Drafts, MD 12/27/14 1416

## 2014-12-27 NOTE — Discharge Instructions (Signed)
Contusion A contusion is a deep bruise. Contusions happen when an injury causes bleeding under the skin. Symptoms of bruising include pain, swelling, and discolored skin. The skin may turn blue, purple, or yellow. HOME CARE   Rest the injured area.  If told, put ice on the injured area.  Put ice in a plastic bag.  Place a towel between your skin and the bag.  Leave the ice on for 20 minutes, 2-3 times per day.  If told, put light pressure (compression) on the injured area using an elastic bandage. Make sure the bandage is not too tight. Remove it and put it back on as told by your doctor.  If possible, raise (elevate) the injured area above the level of your heart while you are sitting or lying down.  Take over-the-counter and prescription medicines only as told by your doctor. GET HELP IF:  Your symptoms do not get better after several days of treatment.  Your symptoms get worse.  You have trouble moving the injured area. GET HELP RIGHT AWAY IF:   You have very bad pain.  You have a loss of feeling (numbness) in a hand or foot.  Your hand or foot turns pale or cold.   This information is not intended to replace advice given to you by your health care provider. Make sure you discuss any questions you have with your health care provider.   Document Released: 07/11/2007 Document Revised: 10/13/2014 Document Reviewed: 06/09/2014 Elsevier Interactive Patient Education 2016 [place] with Dr. Sabra Heck if any continued problems. Take tramadol every 6 hours if needed for pain. Be aware that the tramadol could cause drowsiness which may increase your risk for falling.

## 2015-01-10 ENCOUNTER — Telehealth: Payer: Self-pay | Admitting: Pulmonary Disease

## 2015-01-10 MED ORDER — ALBUTEROL SULFATE HFA 108 (90 BASE) MCG/ACT IN AERS: 2.0000 | INHALATION_SPRAY | Freq: Four times a day (QID) | RESPIRATORY_TRACT | Status: DC | PRN

## 2015-01-10 NOTE — Telephone Encounter (Signed)
Called spoke with pt. She needs ventolin sent in and place on hold as proair is no longer covered. I have done so. Nothing further needed

## 2015-03-15 ENCOUNTER — Encounter: Payer: Self-pay | Admitting: *Deleted

## 2015-03-16 ENCOUNTER — Ambulatory Visit (INDEPENDENT_AMBULATORY_CARE_PROVIDER_SITE_OTHER): Payer: Medicare Other | Admitting: Obstetrics and Gynecology

## 2015-03-16 ENCOUNTER — Encounter: Payer: Self-pay | Admitting: Obstetrics and Gynecology

## 2015-03-16 VITALS — BP 155/86 | HR 65 | Resp 16 | Ht 60.0 in | Wt 100.9 lb

## 2015-03-16 DIAGNOSIS — R300 Dysuria: Secondary | ICD-10-CM

## 2015-03-16 NOTE — Progress Notes (Signed)
03/16/2015 10:34 AM   Kelly Drake 10/18/1930 [id number]  Referring provider: Rusty Aus, MD [place] [facility] [place], [place] [address]  Chief Complaint  Patient presents with  . Dysuria    HPI: Patient is an 82yo female with a history low grade noninvasive TCC of bladder without reoccurrence over the last 5 years.  Last cystoscopy by Dr. Elnoria Howard. 04/2014. She is scheduled next month for surveillance cysto with Dr. Erlene Quan.  She presents today with complaints of some increase in urinary frequency, dysuria and darker urine over the last few days.  No fevers, gross hematuria or flank pain. No exacerbating or alleviating factors.   PMH: Past Medical History  Diagnosis Date  . GERD (gastroesophageal reflux disease)   . COPD (chronic obstructive pulmonary disease) ([place])   . Osteoporosis   . Bladder cancer ([place])   . Hematuria   . Cystitis     Surgical History: Past Surgical History  Procedure Laterality Date  . Partial hysterectomy  1979  . Breast cyst excision  1964    Home Medications:    Medication List       This list is accurate as of: 03/16/15 10:34 AM.  Always use your most recent med list.               AEROCHAMBER MV inhaler  Use as instructed     albuterol 108 (90 Base) MCG/ACT inhaler  Commonly known as:  PROVENTIL HFA;VENTOLIN HFA  Inhale 2 puffs into the lungs every 6 (six) hours as needed for wheezing or shortness of breath.     amLODipine 5 MG tablet  Commonly known as:  NORVASC  Take by mouth.     budesonide-formoterol 80-4.5 MCG/ACT inhaler  Commonly known as:  SYMBICORT  Inhale 2 puffs into the lungs 2 (two) times daily.     CALCIUM 600+D 600-400 MG-UNIT tablet  Generic drug:  Calcium Carbonate-Vitamin D  Take 2 tablets by mouth daily.     cholecalciferol 1000 units tablet  Commonly known as:  VITAMIN D  Take 1,000 Units by mouth daily.     clobetasol ointment 0.05 %  Commonly known as:  TEMOVATE   Apply to affected area every night for 4 weeks, then every other day for 4 weeks and then twice a week for 4 weeks or until resolution.     DULoxetine 20 MG capsule  Commonly known as:  CYMBALTA  Take by mouth.     esomeprazole 20 MG capsule  Commonly known as:  NEXIUM  Take by mouth.     etodolac 300 MG capsule  Commonly known as:  LODINE     Iron Tabs  Take 1 tablet by mouth 2 (two) times a week.     RA VITAMIN B-12 TR 1000 MCG Tbcr  Generic drug:  Cyanocobalamin  Take by mouth.     ranitidine 150 MG tablet  Commonly known as:  ZANTAC  Take 150 mg by mouth 2 (two) times daily.     telmisartan 40 MG tablet  Commonly known as:  MICARDIS  Take 40 mg by mouth daily.     traMADol 50 MG tablet  Commonly known as:  ULTRAM  Take 1 tablet (50 mg total) by mouth every 6 (six) hours as needed.     TRIBENZOR 20-5-12.5 MG Tabs  Generic drug:  Olmesartan-Amlodipine-HCTZ  Take 1 tablet by mouth daily.        Allergies:  Allergies  Allergen Reactions  .  Sulfonamide Derivatives     Family History: Family History  Problem Relation Age of Onset  . Emphysema Brother   . Allergies Mother   . Colon cancer Father     Social History:  reports that she quit smoking about 53 years ago. Her smoking use included Cigarettes. She has a 1.5 pack-year smoking history. She has never used smokeless tobacco. She reports that she does not drink alcohol or use illicit drugs.  ROS: UROLOGY Frequent Urination?: Yes Hard to postpone urination?: No Burning/pain with urination?: No Get up at night to urinate?: No Leakage of urine?: No Urine stream starts and stops?: No Trouble starting stream?: No Do you have to strain to urinate?: No Blood in urine?: No Urinary tract infection?: Yes Sexually transmitted disease?: No Injury to kidneys or bladder?: No Painful intercourse?: No Weak stream?: No Currently pregnant?: No Vaginal bleeding?: No Last menstrual period?:  n/a  Gastrointestinal Nausea?: No Vomiting?: No Indigestion/heartburn?: No Diarrhea?: No Constipation?: No  Constitutional Fever: No Night sweats?: No Weight loss?: No Fatigue?: No  Skin Skin rash/lesions?: No Itching?: No  Eyes Blurred vision?: No Double vision?: No  Ears/Nose/Throat Sore throat?: No Sinus problems?: No  Hematologic/Lymphatic Swollen glands?: No Easy bruising?: No  Cardiovascular Leg swelling?: No Chest pain?: No  Respiratory Cough?: No Shortness of breath?: No  Endocrine Excessive thirst?: No  Musculoskeletal Back pain?: No Joint pain?: No  Neurological Headaches?: No Dizziness?: No  Psychologic Depression?: No Anxiety?: No  Physical Exam: BP 155/86 mmHg  Pulse 65  Resp 16  Ht 5' (1.524 m)  Wt 100 lb 14.4 oz (45.768 kg)  BMI 19.71 kg/m2  Constitutional:  Alert and oriented, No acute distress. HEENT: [place] AT, moist mucus membranes.  Trachea midline, no masses. Respiratory: Normal respiratory effort, no increased work of breathing. GI: Abdomen is soft, nontender GU: No CVA tenderness.  Skin: No rashes, bruises or suspicious lesions. Neurologic: Grossly intact, no focal deficits, moving all 4 extremities. Psychiatric: Normal mood and affect.  Laboratory Data:  No results found for: WBC, HGB, HCT, MCV, PLT  No results found for: CREATININE  No results found for: PSA  No results found for: TESTOSTERONE  No results found for: HGBA1C  Urinalysis See lab documentation  Pertinent Imaging:   Assessment & Plan:    1. Dysuria/Urinary frequency-  UA suspicious for infection. Specimen sent for culture. Patient advised to notify our office should her symptoms worsen or she develops any fevers. We'll treat pending culture results.  Increased fluid intake encouraged. - Urinalysis, Complete   Return for as scheduled next month.  These notes generated with voice recognition software. I apologize for typographical  errors.  Herbert Moors, [facility] [address], [place], [place] [address] [phone]

## 2015-03-17 LAB — MICROSCOPIC EXAMINATION: WBC, UA: >30 /hpf — AB (ref 0–?)

## 2015-03-17 LAB — URINALYSIS, COMPLETE
Bilirubin, UA: NEGATIVE
Glucose, UA: NEGATIVE
Ketones, UA: NEGATIVE
Nitrite, UA: POSITIVE — AB
PH UA: 6 (ref 5.0–7.5)
Protein, UA: NEGATIVE
RBC UA: NEGATIVE
Specific Gravity, UA: 1.01 (ref 1.005–1.030)
Urobilinogen, Ur: 0.2 mg/dL (ref 0.2–1.0)

## 2015-03-18 ENCOUNTER — Telehealth: Payer: Self-pay | Admitting: Obstetrics and Gynecology

## 2015-03-18 LAB — CULTURE, URINE COMPREHENSIVE

## 2015-03-18 MED ORDER — NITROFURANTOIN MONOHYD MACRO 100 MG PO CAPS: 100.0000 mg | ORAL_CAPSULE | Freq: Two times a day (BID) | ORAL | Status: DC

## 2015-03-18 NOTE — Telephone Encounter (Signed)
Spoke with pt in reference to +ucx. Made aware medication has been sent to pharmacy. Pt voiced understanding.

## 2015-03-18 NOTE — Telephone Encounter (Signed)
Please notify patient that her urine culture was positive for infection. I sent a prescription for Macrobid for her to take twice daily 1 week. If her symptoms do not improve she needs to follow up for a recheck prior to her cystoscopy appointment next month. thanks

## 2015-03-24 ENCOUNTER — Institutional Professional Consult (permissible substitution): Payer: Medicare Other | Admitting: Internal Medicine

## 2015-04-15 ENCOUNTER — Encounter: Payer: Self-pay | Admitting: Urology

## 2015-04-15 ENCOUNTER — Ambulatory Visit (INDEPENDENT_AMBULATORY_CARE_PROVIDER_SITE_OTHER): Payer: Medicare Other | Admitting: Urology

## 2015-04-15 VITALS — BP 167/84 | HR 67 | Ht 60.0 in | Wt 102.0 lb

## 2015-04-15 DIAGNOSIS — Z8551 Personal history of malignant neoplasm of bladder: Secondary | ICD-10-CM | POA: Diagnosis not present

## 2015-04-15 LAB — URINALYSIS, COMPLETE
BILIRUBIN UA: NEGATIVE
Glucose, UA: NEGATIVE
Ketones, UA: NEGATIVE
NITRITE UA: NEGATIVE
PH UA: 6.5 (ref 5.0–7.5)
Protein, UA: NEGATIVE
RBC UA: NEGATIVE
Specific Gravity, UA: 1.01 (ref 1.005–1.030)
Urobilinogen, Ur: 0.2 mg/dL (ref 0.2–1.0)

## 2015-04-15 LAB — MICROSCOPIC EXAMINATION: BACTERIA UA: NONE SEEN

## 2015-04-15 MED ORDER — CIPROFLOXACIN HCL 500 MG PO TABS
500.0000 mg | ORAL_TABLET | Freq: Once | ORAL | Status: AC
  Administered 2015-04-15: 500 mg via ORAL

## 2015-04-15 MED ORDER — LIDOCAINE HCL 2 % EX GEL
1.0000 "application " | Freq: Once | CUTANEOUS | Status: AC
  Administered 2015-04-15: 1 via URETHRAL

## 2015-04-15 NOTE — Progress Notes (Signed)
10:03 AM  04/15/2015   Kelly Drake 01/21/1931 [id number]  Referring provider: Rusty Aus, MD [place] [facility] [place], [place] [address]  Chief Complaint  Patient presents with  . Cysto    HPI: 82yo female with a history low grade noninvasive TCC of bladder without reoccurrence over the last 5 years.  Last cystoscopy by Dr. Elnoria Howard. 04/2014.   She returns to the office today for her annual surveillance cystoscopy.    She has a very remote history of smoking.  No gross hematuria.  She was treated recently for an Escherichia coli urinary tract infection on 03/16/2015 and her symptoms have since resolved.    PMH: Past Medical History  Diagnosis Date  . GERD (gastroesophageal reflux disease)   . COPD (chronic obstructive pulmonary disease) ([place])   . Osteoporosis   . Bladder cancer ([place])   . Hematuria   . Cystitis     Surgical History: Past Surgical History  Procedure Laterality Date  . Partial hysterectomy  1979  . Breast cyst excision  1964    Home Medications:    Medication List       This list is accurate as of: 04/15/15 10:03 AM.  Always use your most recent med list.               AEROCHAMBER MV inhaler  Use as instructed     albuterol 108 (90 Base) MCG/ACT inhaler  Commonly known as:  PROVENTIL HFA;VENTOLIN HFA  Inhale 2 puffs into the lungs every 6 (six) hours as needed for wheezing or shortness of breath.     amLODipine 5 MG tablet  Commonly known as:  NORVASC  Take by mouth.     budesonide-formoterol 80-4.5 MCG/ACT inhaler  Commonly known as:  SYMBICORT  Inhale 2 puffs into the lungs 2 (two) times daily.     CALCIUM 600+D 600-400 MG-UNIT tablet  Generic drug:  Calcium Carbonate-Vitamin D  Take 2 tablets by mouth daily.     cholecalciferol 1000 units tablet  Commonly known as:  VITAMIN D  Take 1,000 Units by mouth daily.     clobetasol ointment 0.05 %  Commonly known as:  TEMOVATE  Apply to  affected area every night for 4 weeks, then every other day for 4 weeks and then twice a week for 4 weeks or until resolution.     DULoxetine 20 MG capsule  Commonly known as:  CYMBALTA  Take by mouth.     esomeprazole 20 MG capsule  Commonly known as:  NEXIUM  Take by mouth.     etodolac 300 MG capsule  Commonly known as:  LODINE     Iron Tabs  Take 1 tablet by mouth 2 (two) times a week.     RA VITAMIN B-12 TR 1000 MCG Tbcr  Generic drug:  Cyanocobalamin  Take by mouth.     ranitidine 150 MG tablet  Commonly known as:  ZANTAC  Take 150 mg by mouth 2 (two) times daily.     telmisartan 40 MG tablet  Commonly known as:  MICARDIS  Take 40 mg by mouth daily.     traMADol 50 MG tablet  Commonly known as:  ULTRAM  Take 1 tablet (50 mg total) by mouth every 6 (six) hours as needed.     TRIBENZOR 20-5-12.5 MG Tabs  Generic drug:  Olmesartan-Amlodipine-HCTZ  Take 1 tablet by mouth daily.        Allergies:  Allergies  Allergen  Reactions  . Sulfonamide Derivatives     Family History: Family History  Problem Relation Age of Onset  . Emphysema Brother   . Allergies Mother   . Colon cancer Father     Social History:  reports that she quit smoking about 53 years ago. Her smoking use included Cigarettes. She has a 1.5 pack-year smoking history. She has never used smokeless tobacco. She reports that she does not drink alcohol or use illicit drugs.  Physical Exam: BP 167/84 mmHg  Pulse 67  Ht 5' (1.524 m)  Wt 102 lb (46.267 kg)  BMI 19.92 kg/m2  Constitutional:  Alert and oriented, No acute distress. HEENT: [place] AT, moist mucus membranes.  Trachea midline, no masses. Respiratory: Normal respiratory effort, no increased work of breathing. GI: Abdomen is soft, nontender GU: No CVA tenderness.  Normal urethral meatus. Skin: No rashes, bruises or suspicious lesions. Neurologic: Grossly intact, no focal deficits, moving all 4 extremities. Psychiatric: Normal mood and  affect.  Urinalysis See lab documentation   Cystoscopy Procedure Note  Patient identification was confirmed, informed consent was obtained, and patient was prepped using Betadine solution.  Lidocaine jelly was administered per urethral meatus.    Preoperative abx where received prior to procedure.    Procedure: - Flexible cystoscope introduced, without any difficulty.   - Thorough search of the bladder revealed:    normal urethral meatus    normal urothelium    no stones    no ulcers     no tumors    no urethral polyps    no trabeculation  - Ureteral orifices were normal in position and appearance.  Post-Procedure: - Patient tolerated the procedure well  Assessment & Plan:    1. History of bladder cancer Negative annual cystoscopy.   Unclear the exact year of her last bladder cancer but it has been over 5 years. Discussed possible discontinuation of surveillance, however, patient would prefer to continue this annually which is very reasonable. - Urinalysis, Complete - ciprofloxacin (CIPRO) tablet 500 mg; Take 1 tablet (500 mg total) by mouth once. - lidocaine (XYLOCAINE) 2 % jelly 1 application; Place 1 application into the urethra once.   Return in about 1 year (around 04/14/2016) for cystoscopy.   Hollice Espy, MD  [facility] [address], [place], [place] [address] [phone]

## 2015-04-25 ENCOUNTER — Other Ambulatory Visit: Payer: Self-pay | Admitting: Physician Assistant

## 2015-04-25 DIAGNOSIS — R1319 Other dysphagia: Secondary | ICD-10-CM

## 2015-05-03 ENCOUNTER — Ambulatory Visit: Payer: Medicare Other

## 2015-05-11 ENCOUNTER — Ambulatory Visit: Payer: Medicare Other

## 2015-05-24 ENCOUNTER — Ambulatory Visit: Payer: Medicare Other

## 2015-06-01 ENCOUNTER — Ambulatory Visit: Payer: Medicare Other

## 2015-06-02 ENCOUNTER — Institutional Professional Consult (permissible substitution): Payer: Medicare Other | Admitting: Internal Medicine

## 2015-06-09 ENCOUNTER — Ambulatory Visit: Payer: Medicare Other | Attending: Physician Assistant

## 2015-06-17 ENCOUNTER — Ambulatory Visit: Payer: Medicare Other

## 2015-06-24 ENCOUNTER — Ambulatory Visit: Payer: Medicare Other | Attending: Physician Assistant

## 2015-07-05 ENCOUNTER — Ambulatory Visit: Payer: Medicare Other

## 2015-07-07 ENCOUNTER — Encounter: Payer: Self-pay | Admitting: Internal Medicine

## 2015-07-07 ENCOUNTER — Institutional Professional Consult (permissible substitution): Payer: Medicare Other | Admitting: Internal Medicine

## 2015-07-07 ENCOUNTER — Ambulatory Visit (INDEPENDENT_AMBULATORY_CARE_PROVIDER_SITE_OTHER): Payer: Medicare Other | Admitting: Internal Medicine

## 2015-07-07 VITALS — BP 122/74 | HR 63 | Ht 59.0 in | Wt 100.0 lb

## 2015-07-07 DIAGNOSIS — J449 Chronic obstructive pulmonary disease, unspecified: Secondary | ICD-10-CM

## 2015-07-07 MED ORDER — ALBUTEROL SULFATE HFA 108 (90 BASE) MCG/ACT IN AERS: 2.0000 | INHALATION_SPRAY | Freq: Four times a day (QID) | RESPIRATORY_TRACT | Status: AC | PRN

## 2015-07-07 NOTE — Progress Notes (Signed)
   Subjective:    Patient ID: Kelly Drake, female    DOB: Jun 10, 1930, 82 y.o.   MRN: [id number]  HPI Chief Complaint  Patient presents with  . Follow-up    BQ pt seen for COPD. pt states breathing is doing well. denies sob, cough, wheezing or cp/tightness.    This is a pleasant 82 year old female  She previously smoked as a young adult and says that she was diagnosed with COPD several decades ago.  Since then she has been on inhaled long-acting bronchodilation. She has no SOB, no wheezing, and no cough.  She has a history of pulmonary function testing and oxygen saturation testing as detailed below:  - PFT's 12/20/04 FEV1 1.81 (107%) ratio 57, DLC0 81%, no resopnse to B2  - PFT's 08/01/09 FEV1 1.64 (104%) ratio 54 DLCO 60, corrects to 71% - 12/31/2012 Walked RA x one lap @ 185 stopped due to Cp, sats still 99%  - 10/15/2013 Walked RA @ mod pace x 3 laps @ 185 ft each stopped due to End of study, no sob or desat   No signs of infection at this time        Review of Systems  Constitutional: Negative for fever, diaphoresis and unexpected weight change.  HENT: Negative for trouble swallowing.   Eyes: Negative for redness and itching.  Respiratory: Negative for cough, choking, chest tightness, shortness of breath, wheezing and stridor.   Cardiovascular: Negative for chest pain and leg swelling.  Gastrointestinal: Negative for nausea and vomiting.  Musculoskeletal: Negative for joint swelling.  Skin: Negative for rash.  Neurological: Negative for dizziness.  Hematological: Does not bruise/bleed easily.  Psychiatric/Behavioral: Negative for dysphoric mood. The patient is not nervous/anxious.        Objective:   Physical Exam Filed Vitals:   07/07/15 0903  BP: 122/74  Pulse: 63  Height: 4\' 11"  (1.499 m)  Weight: 100 lb (45.36 kg)  SpO2: 100%   Room air  Gen: well appearing, no acute distress HENT: NCAT, OP clear, neck supple without masses Eyes: PERRL,  EOMi Lymph: no cervical lymphadenopathy PULM: CTA B CV: RRR, no mgr, no JVD neuro: A&Ox4, CN II-XII intact,  Psyche: normal mood and affect  September 2015 chest x-ray there is emphysema with flattening of the diaphragms bilaterally, there is a moderate size hiatal hernia    Assessment & Plan:  82 yo pleasant white female with COPD with emphysema gold stage A This has been a stable interval.   Plan: Continue Symbicort  -albuterol as needed -continue reflux medicine with nexium  Follow up in 6 months    The Patient requires high complexity decision making for assessment and support, frequent evaluation and titration of therapies.   Patient satisfied with Plan of action and management. All questions answered  Corrin Parker, M.D.  [facility]  Medical Director [place] Director [facility] Cardio-Pulmonary Department

## 2015-07-07 NOTE — Patient Instructions (Signed)
Chronic Obstructive Pulmonary Disease Chronic obstructive pulmonary disease (COPD) is a common lung condition in which airflow from the lungs is limited. COPD is a general term that can be used to describe many different lung problems that limit airflow, including both chronic bronchitis and emphysema. If you have COPD, your lung function will probably never return to normal, but there are measures you can take to improve lung function and make yourself feel better. CAUSES   Smoking (common).  Exposure to secondhand smoke.  Genetic problems.  Chronic inflammatory lung diseases or recurrent infections. SYMPTOMS  Shortness of breath, especially with physical activity.  Deep, persistent (chronic) cough with a large amount of thick mucus.  Wheezing.  Rapid breaths (tachypnea).  Gray or bluish discoloration (cyanosis) of the skin, especially in your fingers, toes, or lips.  Fatigue.  Weight loss.  Frequent infections or episodes when breathing symptoms become much worse (exacerbations).  Chest tightness. DIAGNOSIS Your health care provider will take a medical history and perform a physical examination to diagnose COPD. Additional tests for COPD may include:  Lung (pulmonary) function tests.  Chest X-ray.  CT scan.  Blood tests. TREATMENT  Treatment for COPD may include:  Inhaler and nebulizer medicines. These help manage the symptoms of COPD and make your breathing more comfortable.  Supplemental oxygen. Supplemental oxygen is only helpful if you have a low oxygen level in your blood.  Exercise and physical activity. These are beneficial for nearly all people with COPD.  Lung surgery or transplant.  Nutrition therapy to gain weight, if you are underweight.  Pulmonary rehabilitation. This may involve working with a team of health care providers and specialists, such as respiratory, occupational, and physical therapists. HOME CARE INSTRUCTIONS  Take all medicines  (inhaled or pills) as directed by your health care provider.  Avoid over-the-counter medicines or cough syrups that dry up your airway (such as antihistamines) and slow down the elimination of secretions unless instructed otherwise by your health care provider.  If you are a smoker, the most important thing that you can do is stop smoking. Continuing to smoke will cause further lung damage and breathing trouble. Ask your health care provider for help with quitting smoking. He or she can direct you to community resources or hospitals that provide support.  Avoid exposure to irritants such as smoke, chemicals, and fumes that aggravate your breathing.  Use oxygen therapy and pulmonary rehabilitation if directed by your health care provider. If you require home oxygen therapy, ask your health care provider whether you should purchase a pulse oximeter to measure your oxygen level at home.  Avoid contact with individuals who have a contagious illness.  Avoid extreme temperature and humidity changes.  Eat healthy foods. Eating smaller, more frequent meals and resting before meals may help you maintain your strength.  Stay active, but balance activity with periods of rest. Exercise and physical activity will help you maintain your ability to do things you want to do.  Preventing infection and hospitalization is very important when you have COPD. Make sure to receive all the vaccines your health care provider recommends, especially the pneumococcal and influenza vaccines. Ask your health care provider whether you need a pneumonia vaccine.  Learn and use relaxation techniques to manage stress.  Learn and use controlled breathing techniques as directed by your health care provider. Controlled breathing techniques include:  Pursed lip breathing. Start by breathing in (inhaling) through your nose for 1 second. Then, purse your lips as if you were   going to whistle and breathe out (exhale) through the  pursed lips for 2 seconds.  Diaphragmatic breathing. Start by putting one hand on your abdomen just above your waist. Inhale slowly through your nose. The hand on your abdomen should move out. Then purse your lips and exhale slowly. You should be able to feel the hand on your abdomen moving in as you exhale.  Learn and use controlled coughing to clear mucus from your lungs. Controlled coughing is a series of short, progressive coughs. The steps of controlled coughing are: 1. Lean your head slightly forward. 2. Breathe in deeply using diaphragmatic breathing. 3. Try to hold your breath for 3 seconds. 4. Keep your mouth slightly open while coughing twice. 5. Spit any mucus out into a tissue. 6. Rest and repeat the steps once or twice as needed. SEEK MEDICAL CARE IF:  You are coughing up more mucus than usual.  There is a change in the color or thickness of your mucus.  Your breathing is more labored than usual.  Your breathing is faster than usual. SEEK IMMEDIATE MEDICAL CARE IF:  You have shortness of breath while you are resting.  You have shortness of breath that prevents you from:  Being able to talk.  Performing your usual physical activities.  You have chest pain lasting longer than 5 minutes.  Your skin color is more cyanotic than usual.  You measure low oxygen saturations for longer than 5 minutes with a pulse oximeter. MAKE SURE YOU:  Understand these instructions.  Will watch your condition.  Will get help right away if you are not doing well or get worse.   This information is not intended to replace advice given to you by your health care provider. Make sure you discuss any questions you have with your health care provider.   Document Released: 11/01/2004 Document Revised: 02/12/2014 Document Reviewed: 09/18/2012 Elsevier Interactive Patient Education 2016 Else[employer] Inc[employer]  

## 2015-08-10 ENCOUNTER — Other Ambulatory Visit: Payer: Self-pay | Admitting: Internal Medicine

## 2015-08-10 DIAGNOSIS — R1312 Dysphagia, oropharyngeal phase: Secondary | ICD-10-CM

## 2015-08-24 ENCOUNTER — Ambulatory Visit: Admission: RE | Admit: 2015-08-24 | Payer: Medicare Other | Source: Ambulatory Visit

## 2015-09-15 ENCOUNTER — Ambulatory Visit: Payer: Medicare Other

## 2015-09-20 ENCOUNTER — Ambulatory Visit: Payer: Medicare Other

## 2015-09-21 ENCOUNTER — Other Ambulatory Visit: Payer: Self-pay | Admitting: Student

## 2015-09-21 DIAGNOSIS — R1310 Dysphagia, unspecified: Secondary | ICD-10-CM

## 2015-10-05 ENCOUNTER — Ambulatory Visit: Payer: Medicare Other

## 2015-10-13 ENCOUNTER — Ambulatory Visit
Admission: RE | Admit: 2015-10-13 | Discharge: 2015-10-13 | Disposition: A | Discharge status: Home / Self Care | Payer: Medicare Other | Source: Ambulatory Visit | Attending: Student | Admitting: Student

## 2015-10-13 DIAGNOSIS — K222 Esophageal obstruction: Secondary | ICD-10-CM | POA: Diagnosis not present

## 2015-10-13 DIAGNOSIS — R1310 Dysphagia, unspecified: Secondary | ICD-10-CM

## 2015-10-13 DIAGNOSIS — K449 Diaphragmatic hernia without obstruction or gangrene: Secondary | ICD-10-CM | POA: Insufficient documentation

## 2015-11-03 ENCOUNTER — Encounter: Payer: Self-pay | Admitting: *Deleted

## 2015-11-04 ENCOUNTER — Encounter: Payer: Self-pay | Admitting: *Deleted

## 2015-11-04 ENCOUNTER — Ambulatory Visit
Admission: RE | Admit: 2015-11-04 | Discharge: 2015-11-04 | Disposition: A | Discharge status: Home / Self Care | Payer: Medicare Other | Source: Ambulatory Visit | Attending: Unknown Physician Specialty | Admitting: Unknown Physician Specialty

## 2015-11-04 ENCOUNTER — Ambulatory Visit: Payer: Medicare Other | Admitting: Anesthesiology

## 2015-11-04 ENCOUNTER — Encounter
Admission: RE | Disposition: A | Discharge status: Home / Self Care | Payer: Self-pay | Source: Ambulatory Visit | Attending: Unknown Physician Specialty

## 2015-11-04 DIAGNOSIS — E538 Deficiency of other specified B group vitamins: Secondary | ICD-10-CM | POA: Insufficient documentation

## 2015-11-04 DIAGNOSIS — Z8551 Personal history of malignant neoplasm of bladder: Secondary | ICD-10-CM | POA: Insufficient documentation

## 2015-11-04 DIAGNOSIS — I10 Essential (primary) hypertension: Secondary | ICD-10-CM | POA: Diagnosis not present

## 2015-11-04 DIAGNOSIS — Z90710 Acquired absence of both cervix and uterus: Secondary | ICD-10-CM | POA: Diagnosis not present

## 2015-11-04 DIAGNOSIS — K449 Diaphragmatic hernia without obstruction or gangrene: Secondary | ICD-10-CM | POA: Diagnosis not present

## 2015-11-04 DIAGNOSIS — K219 Gastro-esophageal reflux disease without esophagitis: Secondary | ICD-10-CM | POA: Insufficient documentation

## 2015-11-04 DIAGNOSIS — Z87891 Personal history of nicotine dependence: Secondary | ICD-10-CM | POA: Diagnosis not present

## 2015-11-04 DIAGNOSIS — J449 Chronic obstructive pulmonary disease, unspecified: Secondary | ICD-10-CM | POA: Insufficient documentation

## 2015-11-04 DIAGNOSIS — Z882 Allergy status to sulfonamides status: Secondary | ICD-10-CM | POA: Diagnosis not present

## 2015-11-04 DIAGNOSIS — N6019 Diffuse cystic mastopathy of unspecified breast: Secondary | ICD-10-CM | POA: Diagnosis not present

## 2015-11-04 DIAGNOSIS — E785 Hyperlipidemia, unspecified: Secondary | ICD-10-CM | POA: Diagnosis not present

## 2015-11-04 DIAGNOSIS — Z800 Family history of malignant neoplasm of digestive organs: Secondary | ICD-10-CM | POA: Insufficient documentation

## 2015-11-04 DIAGNOSIS — M810 Age-related osteoporosis without current pathological fracture: Secondary | ICD-10-CM | POA: Insufficient documentation

## 2015-11-04 DIAGNOSIS — Z825 Family history of asthma and other chronic lower respiratory diseases: Secondary | ICD-10-CM | POA: Diagnosis not present

## 2015-11-04 DIAGNOSIS — K589 Irritable bowel syndrome without diarrhea: Secondary | ICD-10-CM | POA: Diagnosis not present

## 2015-11-04 DIAGNOSIS — R1310 Dysphagia, unspecified: Secondary | ICD-10-CM | POA: Diagnosis present

## 2015-11-04 DIAGNOSIS — K222 Esophageal obstruction: Secondary | ICD-10-CM | POA: Insufficient documentation

## 2015-11-04 DIAGNOSIS — Z79899 Other long term (current) drug therapy: Secondary | ICD-10-CM | POA: Diagnosis not present

## 2015-11-04 DIAGNOSIS — N3090 Cystitis, unspecified without hematuria: Secondary | ICD-10-CM | POA: Insufficient documentation

## 2015-11-04 HISTORY — DX: Deficiency of other specified B group vitamins: E53.8

## 2015-11-04 HISTORY — DX: Diffuse cystic mastopathy of unspecified breast: N60.19

## 2015-11-04 HISTORY — PX: ESOPHAGOGASTRODUODENOSCOPY (EGD) WITH PROPOFOL: SHX5813

## 2015-11-04 HISTORY — DX: Unspecified asthma, uncomplicated: J45.909

## 2015-11-04 HISTORY — PX: SAVORY DILATION: SHX5439

## 2015-11-04 HISTORY — DX: Essential (primary) hypertension: I10

## 2015-11-04 HISTORY — DX: Personal history of other diseases of the digestive system: Z87.19

## 2015-11-04 HISTORY — DX: Irritable bowel syndrome, unspecified: K58.9

## 2015-11-04 HISTORY — DX: Hyperlipidemia, unspecified: E78.5

## 2015-11-04 SURGERY — ESOPHAGOGASTRODUODENOSCOPY (EGD) WITH PROPOFOL
Anesthesia: General

## 2015-11-04 MED ORDER — SODIUM CHLORIDE 0.9 % IV SOLN
INTRAVENOUS | Status: DC
  Administered 2015-11-04: 1000 mL via INTRAVENOUS

## 2015-11-04 MED ORDER — SODIUM CHLORIDE 0.9 % IV SOLN: INTRAVENOUS | Status: DC

## 2015-11-04 MED ORDER — PROPOFOL 10 MG/ML IV BOLUS
INTRAVENOUS | Status: DC | PRN
  Administered 2015-11-04: 30 mg via INTRAVENOUS
  Administered 2015-11-04: 20 mg via INTRAVENOUS
  Administered 2015-11-04: 50 mg via INTRAVENOUS

## 2015-11-04 MED ORDER — FENTANYL CITRATE (PF) 100 MCG/2ML IJ SOLN
INTRAMUSCULAR | Status: DC | PRN
  Administered 2015-11-04: 50 ug via INTRAVENOUS

## 2015-11-04 MED ORDER — PROPOFOL 500 MG/50ML IV EMUL
INTRAVENOUS | Status: DC | PRN
  Administered 2015-11-04: 100 ug/kg/min via INTRAVENOUS

## 2015-11-04 NOTE — Transfer of Care (Signed)
Immediate Anesthesia Transfer of Care Note  Patient: Kelly Drake  Procedure(s) Performed: Procedure(s): ESOPHAGOGASTRODUODENOSCOPY (EGD) WITH PROPOFOL (N/A) SAVORY DILATION (N/A)  Patient Location: PACU and Endoscopy Unit  Anesthesia Type:General  Level of Consciousness: sedated and responds to stimulation  Airway & Oxygen Therapy: Patient Spontanous Breathing and Patient connected to nasal cannula oxygen  Post-op Assessment: Report given to RN and Post -op Vital signs reviewed and stable  Post vital signs: Reviewed and stable  Last Vitals:  Vitals:   11/04/15 1002 11/04/15 1003  BP: (!) 97/51 (!) 93/51  Pulse: 63 62  Resp: 11   Temp: 36.3 C 36.4 C    Last Pain:  Vitals:   11/04/15 0841  TempSrc: Oral         Complications: No apparent anesthesia complications

## 2015-11-04 NOTE — Op Note (Signed)
[facility] Gastroenterology Patient Name: Kelly Drake Procedure Date: 11/04/2015 9:41 AM MRN: [id number] Account #: [id number] Date of Birth: 07/07/1930 Admit Type: Outpatient Age: 82 Room: [facility] ENDO ROOM 1 Gender: Female Note Status: Finalized Procedure:            Upper GI endoscopy Indications:          Dysphagia Providers:            Manya Silvas, MD Referring MD:         Rusty Aus, MD (Referring MD) Medicines:            Propofol per Anesthesia Complications:        No immediate complications. Procedure:            Pre-Anesthesia Assessment:                       - After reviewing the risks and benefits, the patient                        was deemed in satisfactory condition to undergo the                        procedure.                       After obtaining informed consent, the endoscope was                        passed under direct vision. Throughout the procedure,                        the patient's blood pressure, pulse, and oxygen                        saturations were monitored continuously. The Endoscope                        was introduced through the mouth, and advanced to the                        second part of duodenum. The upper GI endoscopy was                        accomplished without difficulty. The patient tolerated                        the procedure well. Findings:      A mild Schatzki ring (acquired) was found at the gastroesophageal       junction. A guidewire was placed and the scope was withdrawn. Dilation       was performed with a Savary dilator with mild resistance at 15 mm, 16 mm       and 17 mm.      A medium-sized hiatal hernia was present.      The examined duodenum was normal. Impression:           - Mild Schatzki ring. Dilated.                       - Medium-sized hiatal hernia.                       -  Normal examined duodenum.                       - No specimens collected. Recommendation:       -  Mechanical soft diet for 3 days. Manya Silvas, MD 11/04/2015 10:00:44 AM This report has been signed electronically. Number of Addenda: 0 Note Initiated On: 11/04/2015 9:41 AM      [facility]

## 2015-11-04 NOTE — H&P (Signed)
Primary Care Physician:  Rusty Aus, MD Primary Gastroenterologist:  Dr. Vira Agar  Pre-Procedure History & Physical: HPI:  Kelly Drake is a 82 y.o. female is here for an endoscopy.   Past Medical History:  Diagnosis Date  . Asthma   . B12 deficiency   . Bladder cancer ([place])   . COPD (chronic obstructive pulmonary disease) ([place])   . Cystitis   . Fibrocystic breast disease   . GERD (gastroesophageal reflux disease)   . Hematuria   . History of hiatal hernia   . Hyperlipidemia   . Hypertension   . IBS (irritable bowel syndrome)   . Osteoporosis     Past Surgical History:  Procedure Laterality Date  . BREAST CYST EXCISION  1964  . PARTIAL HYSTERECTOMY  1979    Prior to Admission medications   Medication Sig Start Date End Date Taking? Authorizing Provider  amLODipine (NORVASC) 5 MG tablet Take by mouth. 04/21/14  Yes Historical Provider, MD  budesonide-formoterol (SYMBICORT) 80-4.5 MCG/ACT inhaler Inhale 2 puffs into the lungs 2 (two) times daily.   Yes Historical Provider, MD  Olmesartan-Amlodipine-HCTZ (TRIBENZOR) 20-5-12.5 MG TABS Take 1 tablet by mouth daily.   Yes Historical Provider, MD  albuterol (PROVENTIL HFA;VENTOLIN HFA) 108 (90 Base) MCG/ACT inhaler Inhale 2 puffs into the lungs every 6 (six) hours as needed for wheezing or shortness of breath. 07/07/15   Flora Lipps, MD  Calcium Carbonate-Vitamin D (CALCIUM 600+D) 600-400 MG-UNIT per tablet Take 2 tablets by mouth daily.     Historical Provider, MD  cholecalciferol (VITAMIN D) 1000 UNITS tablet Take 1,000 Units by mouth daily.      Historical Provider, MD  clobetasol ointment (TEMOVATE) 0.05 % Apply to affected area every night for 4 weeks, then every other day for 4 weeks and then twice a week for 4 weeks or until resolution. 10/01/14   Emily Filbert, MD  Cyanocobalamin (RA VITAMIN B-12 TR) 1000 MCG TBCR Take by mouth.    Historical Provider, MD  DULoxetine (CYMBALTA) 20 MG capsule Take by mouth. 04/21/14 04/21/15   Historical Provider, MD  esomeprazole (NEXIUM) 20 MG capsule Take by mouth.    Historical Provider, MD  etodolac (LODINE) 300 MG capsule  03/14/15   Historical Provider, MD  Iron TABS Take 1 tablet by mouth 2 (two) times a week.     Historical Provider, MD  ranitidine (ZANTAC) 150 MG tablet Take 150 mg by mouth 2 (two) times daily.     Historical Provider, MD  Spacer/Aero-Holding Chambers (AEROCHAMBER MV) inhaler Use as instructed 07/08/14   Juanito Doom, MD  telmisartan (MICARDIS) 40 MG tablet Take 40 mg by mouth daily.    Historical Provider, MD    Allergies as of 11/02/2015 - Review Complete 07/07/2015  Allergen Reaction Noted  . Sulfonamide derivatives      Family History  Problem Relation Age of Onset  . Allergies Mother   . Colon cancer Father   . Emphysema Brother     Social History   Social History  . Marital status: Widowed    Spouse name: N/A  . Number of children: N/A  . Years of education: N/A   Occupational History  . Retired    Social History Main Topics  . Smoking status: Former Smoker    Packs/day: 0.50    Years: 3.00    Types: Cigarettes    Quit date: 02/05/1962  . Smokeless tobacco: Never Used  . Alcohol use No  . Drug  use: No  . Sexual activity: Not on file   Other Topics Concern  . Not on file   Social History Narrative  . No narrative on file    Review of Systems: See HPI, otherwise negative ROS  Physical Exam: BP (!) 151/71   Pulse 63   Temp 97.9 F (36.6 C) (Oral)   Resp 18   Ht 4\' 11"  (1.499 m)   Wt 45.8 kg (101 lb)   SpO2 100%   BMI 20.40 kg/m  General:   Alert,  pleasant and cooperative in NAD Head:  Normocephalic and atraumatic. Neck:  Supple; no masses or thyromegaly. Lungs:  Clear throughout to auscultation.    Heart:  Regular rate and rhythm. Abdomen:  Soft, nontender and nondistended. Normal bowel sounds, without guarding, and without rebound.   Neurologic:  Alert and  oriented x4;  grossly normal  neurologically.  Impression/Plan: Kelly Drake is here for an endoscopy to be performed for dysphagia  Risks, benefits, limitations, and alternatives regarding  endoscopy have been reviewed with the patient.  Questions have been answered.  All parties agreeable.   Gaylyn Cheers, MD  11/04/2015, 9:33 AM

## 2015-11-04 NOTE — Anesthesia Postprocedure Evaluation (Signed)
Anesthesia Post Note  Patient: Kelly Drake  Procedure(s) Performed: Procedure(s) (LRB): ESOPHAGOGASTRODUODENOSCOPY (EGD) WITH PROPOFOL (N/A) SAVORY DILATION (N/A)  Patient location during evaluation: Endoscopy Anesthesia Type: General Level of consciousness: awake and alert Pain management: pain level controlled Vital Signs Assessment: post-procedure vital signs reviewed and stable Respiratory status: spontaneous breathing, nonlabored ventilation, respiratory function stable and patient connected to nasal cannula oxygen Cardiovascular status: blood pressure returned to baseline and stable Postop Assessment: no signs of nausea or vomiting Anesthetic complications: no    Last Vitals:  Vitals:   11/04/15 1022 11/04/15 1032  BP: (!) 152/96 (!) 171/91  Pulse: (!) 55 62  Resp: 14 11  Temp:      Last Pain:  Vitals:   11/04/15 0841  TempSrc: Oral                 Martha Clan

## 2015-11-04 NOTE — Anesthesia Preprocedure Evaluation (Signed)
Anesthesia Evaluation  Patient identified by MRN, date of birth, ID band Patient awake    Reviewed: Allergy & Precautions, H&P , NPO status , Patient's Chart, lab work & pertinent test results, reviewed documented beta blocker date and time   Airway Mallampati: II  TM Distance: >3 FB Neck ROM: full    Dental no notable dental hx. (+) Partial Upper, Partial Lower   Pulmonary neg shortness of breath, asthma , neg sleep apnea, COPD,  COPD inhaler, Recent URI , Resolved, former smoker,           Cardiovascular Exercise Tolerance: Good hypertension, On Medications (-) angina(-) CAD, (-) Past MI, (-) Cardiac Stents and (-) CABG (-) dysrhythmias (-) Valvular Problems/Murmurs     Neuro/Psych negative neurological ROS  negative psych ROS   GI/Hepatic Neg liver ROS, hiatal hernia, GERD  ,  Endo/Other  negative endocrine ROS  Renal/GU negative Renal ROS  negative genitourinary   Musculoskeletal   Abdominal   Peds  Hematology negative hematology ROS (+)   Anesthesia Other Findings Past Medical History: No date: Asthma No date: B12 deficiency No date: Bladder cancer ([place]) No date: COPD (chronic obstructive pulmonary disease) (* No date: Cystitis No date: Fibrocystic breast disease No date: GERD (gastroesophageal reflux disease) No date: Hematuria No date: History of hiatal hernia No date: Hyperlipidemia No date: Hypertension No date: IBS (irritable bowel syndrome) No date: Osteoporosis   Reproductive/Obstetrics negative OB ROS                             Anesthesia Physical Anesthesia Plan  ASA: II  Anesthesia Plan: General   Post-op Pain Management:    Induction:   Airway Management Planned:   Additional Equipment:   Intra-op Plan:   Post-operative Plan:   Informed Consent: I have reviewed the patients History and Physical, chart, labs and discussed the procedure including the  risks, benefits and alternatives for the proposed anesthesia with the patient or authorized representative who has indicated his/her understanding and acceptance.   Dental Advisory Given  Plan Discussed with: Anesthesiologist, CRNA and Surgeon  Anesthesia Plan Comments:         Anesthesia Quick Evaluation

## 2015-11-07 ENCOUNTER — Encounter: Payer: Self-pay | Admitting: Unknown Physician Specialty

## 2015-11-29 ENCOUNTER — Other Ambulatory Visit: Payer: Self-pay | Admitting: Internal Medicine

## 2015-11-29 DIAGNOSIS — Z1231 Encounter for screening mammogram for malignant neoplasm of breast: Secondary | ICD-10-CM

## 2015-12-05 ENCOUNTER — Telehealth: Payer: Self-pay | Admitting: Internal Medicine

## 2015-12-05 NOTE — Telephone Encounter (Signed)
Patient says she cannot affor the albuterol inhaler insurance will not cover.  Is there something else Dr. Mortimer Fries would like to order? Please call.

## 2015-12-05 NOTE — Telephone Encounter (Signed)
Spoke with pt and informed her that she needs to contact her insurance and ask what they will cover for albuterol. Pt states her pharmacy has called her back and can help her get her inhaler. Informed pt to call back once insurance lets her know what is covered and we can send it to the pharmacy. Nothing further needed.

## 2015-12-28 ENCOUNTER — Ambulatory Visit: Payer: Medicare Other

## 2016-01-11 ENCOUNTER — Encounter: Payer: Self-pay | Admitting: Internal Medicine

## 2016-01-11 ENCOUNTER — Ambulatory Visit (INDEPENDENT_AMBULATORY_CARE_PROVIDER_SITE_OTHER): Payer: Medicare Other | Admitting: Internal Medicine

## 2016-01-11 VITALS — BP 116/72 | HR 74 | Ht 59.0 in | Wt 101.0 lb

## 2016-01-11 DIAGNOSIS — J449 Chronic obstructive pulmonary disease, unspecified: Secondary | ICD-10-CM | POA: Diagnosis not present

## 2016-01-11 NOTE — Progress Notes (Signed)
   Subjective:    Patient ID: Kelly Drake, female    DOB: [birth year], 82 y.o.   MRN: [id number]  HPI Chief Complaint  Patient presents with  . Follow-up    COPD: breathing doing well. no concerns today   This is a pleasant 82 year old female  She previously smoked as a young adult and says that she was diagnosed with COPD several decades ago.  Since then she has been on inhaled long-acting bronchodilation. She has no SOB, no wheezing, and no cough. Follow up COPD-doing well on current inhaler  She has a history of pulmonary function testing and oxygen saturation testing as detailed below:  - PFT's 12/20/04 FEV1 1.81 (107%) ratio 57, DLC0 81%, no resopnse to B2  - PFT's 08/01/09 FEV1 1.64 (104%) ratio 54 DLCO 60, corrects to 71% - 12/31/2012 Walked RA x one lap @ 185 stopped due to Cp, sats still 99%  - 10/15/2013 Walked RA @ mod pace x 3 laps @ 185 ft each stopped due to End of study, no sob or desat   No signs of infection at this time        Review of Systems  Constitutional: Negative for diaphoresis, fever and unexpected weight change.  HENT: Negative for trouble swallowing.   Eyes: Negative for redness and itching.  Respiratory: Negative for cough, choking, chest tightness, shortness of breath, wheezing and stridor.   Cardiovascular: Negative for chest pain and leg swelling.  Gastrointestinal: Negative for nausea and vomiting.  Musculoskeletal: Negative for joint swelling.  Skin: Negative for rash.  Neurological: Negative for dizziness.  Hematological: Does not bruise/bleed easily.  Psychiatric/Behavioral: Negative for dysphoric mood. The patient is not nervous/anxious.        Objective:   Physical Exam Vitals:   01/11/16 1008  BP: 116/72  Pulse: 74  SpO2: 98%  Weight: 101 lb (45.8 kg)  Height: 4\' 11"  (1.499 m)   Room air  Gen: well appearing, no acute distress HENT: NCAT, OP clear, neck supple without masses Eyes: PERRL, EOMi Lymph: no cervical  lymphadenopathy PULM: CTA B CV: RRR, no mgr, no JVD neuro: A&Ox4, CN II-XII intact,  Psyche: normal mood and affect  September 2015 chest x-ray there is emphysema with flattening of the diaphragms bilaterally, there is a moderate size hiatal hernia    Assessment & Plan:  82 yo pleasant white female with COPD with emphysema gold stage A This has been a stable interval.   Plan: Continue Symbicort  -albuterol as needed -continue reflux medicine with nexium  Follow up in 6 months    The Patient requires high complexity decision making for assessment and support, frequent evaluation and titration of therapies.   Patient satisfied with Plan of action and management. All questions answered  Corrin Parker, M.D.  [facility]  Medical Director [place] Director [facility] Cardio-Pulmonary Department

## 2016-01-11 NOTE — Patient Instructions (Signed)
Continue inhalers as prescribed 

## 2016-02-07 ENCOUNTER — Ambulatory Visit
Admission: RE | Admit: 2016-02-07 | Discharge: 2016-02-07 | Disposition: A | Discharge status: Home / Self Care | Payer: PPO | Source: Ambulatory Visit | Attending: Internal Medicine | Admitting: Internal Medicine

## 2016-02-07 DIAGNOSIS — Z1231 Encounter for screening mammogram for malignant neoplasm of breast: Secondary | ICD-10-CM | POA: Insufficient documentation

## 2016-02-29 DIAGNOSIS — L570 Actinic keratosis: Secondary | ICD-10-CM | POA: Diagnosis not present

## 2016-02-29 DIAGNOSIS — X32XXXA Exposure to sunlight, initial encounter: Secondary | ICD-10-CM | POA: Diagnosis not present

## 2016-02-29 DIAGNOSIS — L710 Perioral dermatitis: Secondary | ICD-10-CM | POA: Diagnosis not present

## 2016-02-29 DIAGNOSIS — L814 Other melanin hyperpigmentation: Secondary | ICD-10-CM | POA: Diagnosis not present

## 2016-03-06 DIAGNOSIS — H353131 Nonexudative age-related macular degeneration, bilateral, early dry stage: Secondary | ICD-10-CM | POA: Diagnosis not present

## 2016-03-13 DIAGNOSIS — J40 Bronchitis, not specified as acute or chronic: Secondary | ICD-10-CM | POA: Diagnosis not present

## 2016-04-13 ENCOUNTER — Encounter: Payer: Self-pay | Admitting: Urology

## 2016-04-13 ENCOUNTER — Ambulatory Visit: Payer: PPO | Admitting: Urology

## 2016-04-13 VITALS — BP 174/81 | HR 69 | Ht 59.0 in | Wt 103.0 lb

## 2016-04-13 DIAGNOSIS — Z8551 Personal history of malignant neoplasm of bladder: Secondary | ICD-10-CM

## 2016-04-13 LAB — URINALYSIS, COMPLETE
BILIRUBIN UA: NEGATIVE
Glucose, UA: NEGATIVE
Ketones, UA: NEGATIVE
LEUKOCYTES UA: NEGATIVE
Nitrite, UA: NEGATIVE
PH UA: 7 (ref 5.0–7.5)
Protein, UA: NEGATIVE
RBC UA: NEGATIVE
Specific Gravity, UA: 1.01 (ref 1.005–1.030)
Urobilinogen, Ur: 0.2 mg/dL (ref 0.2–1.0)

## 2016-04-13 LAB — MICROSCOPIC EXAMINATION
BACTERIA UA: NONE SEEN
RBC MICROSCOPIC, UA: NONE SEEN /HPF (ref 0–?)

## 2016-04-13 MED ORDER — LIDOCAINE HCL 2 % EX GEL
1.0000 "application " | Freq: Once | CUTANEOUS | Status: AC
  Administered 2016-04-13: 1 via URETHRAL

## 2016-04-13 MED ORDER — CIPROFLOXACIN HCL 500 MG PO TABS
500.0000 mg | ORAL_TABLET | Freq: Once | ORAL | Status: AC
  Administered 2016-04-13: 500 mg via ORAL

## 2016-04-13 NOTE — Progress Notes (Signed)
1:45 PM  04/13/16   Kelly Drake Oct 13, 1930 [id number]  Referring provider: Rusty Aus, MD [facility] [place], [place] [address]  Chief Complaint  Patient presents with  . Cysto    HPI: 83 yo female with a history low grade noninvasive TCC of bladder without reoccurrence over the last 5 years.  Last cystoscopy one year ago NED.  She returns to the office today for her annual surveillance cystoscopy.    She has a very remote history of smoking.  No gross hematuria.  She is not had a urinary tract infection for greater than one year.  She does report that occasionally she has difficulty emptying her bladder.  Otherwise, she has no voiding complaints today.  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    PMH: Past Medical History:  Diagnosis Date  . Asthma   . B12 deficiency   . Bladder cancer ([place])   . COPD (chronic obstructive pulmonary disease) ([place])   . Cystitis   . Fibrocystic breast disease   . GERD (gastroesophageal reflux disease)   . Hematuria   . History of hiatal hernia   . Hyperlipidemia   . Hypertension   . IBS (irritable bowel syndrome)   . Osteoporosis     Surgical History: Past Surgical History:  Procedure Laterality Date  . BREAST CYST EXCISION  1964  . ESOPHAGOGASTRODUODENOSCOPY (EGD) WITH PROPOFOL N/A 11/04/2015   Procedure: ESOPHAGOGASTRODUODENOSCOPY (EGD) WITH PROPOFOL;  Surgeon: Manya Silvas, MD;  Location: [facility] ENDOSCOPY;  Service: Endoscopy;  Laterality: N/A;  . PARTIAL HYSTERECTOMY  1979  . SAVORY DILATION N/A 11/04/2015   Procedure: SAVORY DILATION;  Surgeon: Manya Silvas, MD;  Location: [facility] ENDOSCOPY;  Service: Endoscopy;  Laterality: N/A;    Home Medications:  Allergies as of 04/13/2016      Reactions   Sulfonamide Derivatives       Medication List       Accurate as of 04/13/16  1:45 PM. Always use your most recent med list.          AEROCHAMBER MV  inhaler Use as instructed   albuterol 108 (90 Base) MCG/ACT inhaler Commonly known as:  PROVENTIL HFA;VENTOLIN HFA Inhale 2 puffs into the lungs every 6 (six) hours as needed for wheezing or shortness of breath.   amLODipine 5 MG tablet Commonly known as:  NORVASC Take by mouth.   budesonide-formoterol 80-4.5 MCG/ACT inhaler Commonly known as:  SYMBICORT Inhale 2 puffs into the lungs 2 (two) times daily.   CALCIUM 600+D 600-400 MG-UNIT tablet Generic drug:  Calcium Carbonate-Vitamin D Take 2 tablets by mouth daily.   cholecalciferol 1000 units tablet Commonly known as:  VITAMIN D Take 1,000 Units by mouth daily.   clobetasol ointment 0.05 % Commonly known as:  TEMOVATE Apply to affected area every night for 4 weeks, then every other day for 4 weeks and then twice a week for 4 weeks or until resolution.   DULoxetine 20 MG capsule Commonly known as:  CYMBALTA Take by mouth.   esomeprazole 20 MG capsule Commonly known as:  NEXIUM Take by mouth.   etodolac 300 MG capsule Commonly known as:  LODINE   Iron Tabs Take 1 tablet by mouth 2 (two) times a week.   RA VITAMIN B-12 TR 1000 MCG Tbcr Generic drug:  Cyanocobalamin Take by mouth.   ranitidine 150 MG tablet Commonly known as:  ZANTAC Take 150 mg by mouth 2 (  two) times daily.   telmisartan 40 MG tablet Commonly known as:  MICARDIS Take 40 mg by mouth daily.   TRIBENZOR 20-5-12.5 MG Tabs Generic drug:  Olmesartan-Amlodipine-HCTZ Take 1 tablet by mouth daily.       Allergies:  Allergies  Allergen Reactions  . Sulfonamide Derivatives     Family History: Family History  Problem Relation Age of Onset  . Allergies Mother   . Colon cancer Father   . Emphysema Brother     Social History:  reports that she quit smoking about 54 years ago. Her smoking use included Cigarettes. She has a 1.50 pack-year smoking history. She has never used smokeless tobacco. She reports that she does not drink alcohol or use  drugs.  Physical Exam: BP (!) 174/81   Pulse 69   Ht 4\' 11"  (1.499 m)   Wt 103 lb (46.7 kg)   BMI 20.80 kg/m   Constitutional:  Alert and oriented, No acute distress. HEENT: [place] AT, moist mucus membranes.  Trachea midline, no masses. Respiratory: Normal respiratory effort, no increased work of breathing. GI: Abdomen is soft, nontender GU: No CVA tenderness.  Normal urethral meatus. Skin: No rashes, bruises or suspicious lesions. Neurologic: Grossly intact, no focal deficits, moving all 4 extremities. Psychiatric: Normal mood and affect.  Urinalysis See lab documentation   Cystoscopy Procedure Note  Patient identification was confirmed, informed consent was obtained, and patient was prepped using Betadine solution.  Lidocaine jelly was administered per urethral meatus.    Preoperative abx where received prior to procedure.    Procedure: - Flexible cystoscope introduced, without any difficulty.   - Thorough search of the bladder revealed:    normal urethral meatus    normal urothelium    no stones    no ulcers     no tumors    no urethral polyps    no trabeculation although 2 [place] diverticula were seen at the left and right superior aspect of the bladder  - Ureteral orifices were normal in position and appearance with very subtle trigonitis  Post-Procedure: - Patient tolerated the procedure well  Assessment & Plan:    1. History of bladder cancer Negative annual cystoscopy.   Unclear the exact year of her last bladder cancer but it has been over 5 years. She continues to desire and ureteral surveillance which is reasonable - Urinalysis, Complete - ciprofloxacin (CIPRO) tablet 500 mg; Take 1 tablet (500 mg total) by mouth once. - lidocaine (XYLOCAINE) 2 % jelly 1 application; Place 1 application into the urethra once.   Return in about 1 year (around 04/13/2017) for cysto.   Hollice Espy, MD  [facility] [address] [place], [place] [address] [phone]

## 2016-04-27 DIAGNOSIS — K121 Other forms of stomatitis: Secondary | ICD-10-CM | POA: Diagnosis not present

## 2016-04-27 DIAGNOSIS — M47812 Spondylosis without myelopathy or radiculopathy, cervical region: Secondary | ICD-10-CM | POA: Diagnosis not present

## 2016-04-27 DIAGNOSIS — H6123 Impacted cerumen, bilateral: Secondary | ICD-10-CM | POA: Diagnosis not present

## 2016-04-27 DIAGNOSIS — M542 Cervicalgia: Secondary | ICD-10-CM | POA: Diagnosis not present

## 2016-05-22 DIAGNOSIS — J40 Bronchitis, not specified as acute or chronic: Secondary | ICD-10-CM | POA: Diagnosis not present

## 2016-05-22 DIAGNOSIS — R634 Abnormal weight loss: Secondary | ICD-10-CM | POA: Diagnosis not present

## 2016-05-25 DIAGNOSIS — J40 Bronchitis, not specified as acute or chronic: Secondary | ICD-10-CM | POA: Diagnosis not present

## 2016-05-28 DIAGNOSIS — R634 Abnormal weight loss: Secondary | ICD-10-CM | POA: Diagnosis not present

## 2016-05-28 DIAGNOSIS — J40 Bronchitis, not specified as acute or chronic: Secondary | ICD-10-CM | POA: Diagnosis not present

## 2016-06-05 ENCOUNTER — Other Ambulatory Visit: Payer: Self-pay | Admitting: Internal Medicine

## 2016-06-05 DIAGNOSIS — E538 Deficiency of other specified B group vitamins: Secondary | ICD-10-CM | POA: Diagnosis not present

## 2016-06-05 DIAGNOSIS — D500 Iron deficiency anemia secondary to blood loss (chronic): Secondary | ICD-10-CM | POA: Diagnosis not present

## 2016-06-05 DIAGNOSIS — R1312 Dysphagia, oropharyngeal phase: Secondary | ICD-10-CM

## 2016-06-05 DIAGNOSIS — R634 Abnormal weight loss: Secondary | ICD-10-CM | POA: Diagnosis not present

## 2016-06-05 DIAGNOSIS — R05 Cough: Secondary | ICD-10-CM | POA: Diagnosis not present

## 2016-06-12 ENCOUNTER — Ambulatory Visit
Admission: RE | Admit: 2016-06-12 | Discharge: 2016-06-12 | Disposition: A | Discharge status: Home / Self Care | Payer: PPO | Source: Ambulatory Visit | Attending: Internal Medicine | Admitting: Internal Medicine

## 2016-06-12 DIAGNOSIS — R1312 Dysphagia, oropharyngeal phase: Secondary | ICD-10-CM

## 2016-06-12 DIAGNOSIS — K219 Gastro-esophageal reflux disease without esophagitis: Secondary | ICD-10-CM | POA: Diagnosis not present

## 2016-06-12 DIAGNOSIS — K449 Diaphragmatic hernia without obstruction or gangrene: Secondary | ICD-10-CM | POA: Insufficient documentation

## 2016-07-03 DIAGNOSIS — K440 Diaphragmatic hernia with obstruction, without gangrene: Secondary | ICD-10-CM | POA: Diagnosis not present

## 2016-07-11 DIAGNOSIS — R0609 Other forms of dyspnea: Secondary | ICD-10-CM | POA: Diagnosis not present

## 2016-07-11 DIAGNOSIS — E43 Unspecified severe protein-calorie malnutrition: Secondary | ICD-10-CM | POA: Diagnosis not present

## 2016-07-12 DIAGNOSIS — R0609 Other forms of dyspnea: Secondary | ICD-10-CM | POA: Diagnosis not present

## 2016-07-12 DIAGNOSIS — E43 Unspecified severe protein-calorie malnutrition: Secondary | ICD-10-CM | POA: Diagnosis not present

## 2016-07-13 ENCOUNTER — Inpatient Hospital Stay (HOSPITAL_COMMUNITY)
Admission: EM | Admit: 2016-07-13 | Discharge: 2016-08-05 | DRG: 208 | Disposition: E | Payer: PPO | Attending: Nephrology | Admitting: Nephrology

## 2016-07-13 ENCOUNTER — Encounter (HOSPITAL_COMMUNITY): Payer: Self-pay

## 2016-07-13 DIAGNOSIS — S0990XA Unspecified injury of head, initial encounter: Secondary | ICD-10-CM | POA: Diagnosis not present

## 2016-07-13 DIAGNOSIS — E870 Hyperosmolality and hypernatremia: Secondary | ICD-10-CM | POA: Diagnosis not present

## 2016-07-13 DIAGNOSIS — R410 Disorientation, unspecified: Secondary | ICD-10-CM | POA: Diagnosis not present

## 2016-07-13 DIAGNOSIS — J690 Pneumonitis due to inhalation of food and vomit: Principal | ICD-10-CM | POA: Diagnosis present

## 2016-07-13 DIAGNOSIS — A419 Sepsis, unspecified organism: Secondary | ICD-10-CM | POA: Diagnosis not present

## 2016-07-13 DIAGNOSIS — Z8551 Personal history of malignant neoplasm of bladder: Secondary | ICD-10-CM | POA: Diagnosis not present

## 2016-07-13 DIAGNOSIS — R9431 Abnormal electrocardiogram [ECG] [EKG]: Secondary | ICD-10-CM | POA: Diagnosis present

## 2016-07-13 DIAGNOSIS — J9311 Primary spontaneous pneumothorax: Secondary | ICD-10-CM | POA: Diagnosis not present

## 2016-07-13 DIAGNOSIS — Z681 Body mass index (BMI) 19 or less, adult: Secondary | ICD-10-CM

## 2016-07-13 DIAGNOSIS — E538 Deficiency of other specified B group vitamins: Secondary | ICD-10-CM | POA: Diagnosis present

## 2016-07-13 DIAGNOSIS — K219 Gastro-esophageal reflux disease without esophagitis: Secondary | ICD-10-CM | POA: Diagnosis not present

## 2016-07-13 DIAGNOSIS — Y838 Other surgical procedures as the cause of abnormal reaction of the patient, or of later complication, without mention of misadventure at the time of the procedure: Secondary | ICD-10-CM | POA: Diagnosis not present

## 2016-07-13 DIAGNOSIS — M810 Age-related osteoporosis without current pathological fracture: Secondary | ICD-10-CM | POA: Diagnosis not present

## 2016-07-13 DIAGNOSIS — Z66 Do not resuscitate: Secondary | ICD-10-CM | POA: Diagnosis not present

## 2016-07-13 DIAGNOSIS — J432 Centrilobular emphysema: Secondary | ICD-10-CM

## 2016-07-13 DIAGNOSIS — J439 Emphysema, unspecified: Secondary | ICD-10-CM | POA: Diagnosis not present

## 2016-07-13 DIAGNOSIS — F0390 Unspecified dementia without behavioral disturbance: Secondary | ICD-10-CM | POA: Diagnosis present

## 2016-07-13 DIAGNOSIS — R6521 Severe sepsis with septic shock: Secondary | ICD-10-CM | POA: Diagnosis not present

## 2016-07-13 DIAGNOSIS — E876 Hypokalemia: Secondary | ICD-10-CM | POA: Diagnosis present

## 2016-07-13 DIAGNOSIS — J90 Pleural effusion, not elsewhere classified: Secondary | ICD-10-CM | POA: Diagnosis not present

## 2016-07-13 DIAGNOSIS — Z01818 Encounter for other preprocedural examination: Secondary | ICD-10-CM

## 2016-07-13 DIAGNOSIS — E785 Hyperlipidemia, unspecified: Secondary | ICD-10-CM | POA: Diagnosis present

## 2016-07-13 DIAGNOSIS — I10 Essential (primary) hypertension: Secondary | ICD-10-CM | POA: Diagnosis not present

## 2016-07-13 DIAGNOSIS — T17908A Unspecified foreign body in respiratory tract, part unspecified causing other injury, initial encounter: Secondary | ICD-10-CM | POA: Diagnosis not present

## 2016-07-13 DIAGNOSIS — J9600 Acute respiratory failure, unspecified whether with hypoxia or hypercapnia: Secondary | ICD-10-CM

## 2016-07-13 DIAGNOSIS — R1110 Vomiting, unspecified: Secondary | ICD-10-CM

## 2016-07-13 DIAGNOSIS — Z87891 Personal history of nicotine dependence: Secondary | ICD-10-CM

## 2016-07-13 DIAGNOSIS — Z4682 Encounter for fitting and adjustment of non-vascular catheter: Secondary | ICD-10-CM | POA: Diagnosis not present

## 2016-07-13 DIAGNOSIS — N179 Acute kidney failure, unspecified: Secondary | ICD-10-CM | POA: Diagnosis present

## 2016-07-13 DIAGNOSIS — Z8719 Personal history of other diseases of the digestive system: Secondary | ICD-10-CM

## 2016-07-13 DIAGNOSIS — Z882 Allergy status to sulfonamides status: Secondary | ICD-10-CM

## 2016-07-13 DIAGNOSIS — D62 Acute posthemorrhagic anemia: Secondary | ICD-10-CM | POA: Diagnosis not present

## 2016-07-13 DIAGNOSIS — Z4659 Encounter for fitting and adjustment of other gastrointestinal appliance and device: Secondary | ICD-10-CM

## 2016-07-13 DIAGNOSIS — J9621 Acute and chronic respiratory failure with hypoxia: Secondary | ICD-10-CM | POA: Diagnosis not present

## 2016-07-13 DIAGNOSIS — J9601 Acute respiratory failure with hypoxia: Secondary | ICD-10-CM | POA: Diagnosis not present

## 2016-07-13 DIAGNOSIS — J95811 Postprocedural pneumothorax: Secondary | ICD-10-CM | POA: Diagnosis not present

## 2016-07-13 DIAGNOSIS — R627 Adult failure to thrive: Secondary | ICD-10-CM | POA: Diagnosis present

## 2016-07-13 DIAGNOSIS — Z825 Family history of asthma and other chronic lower respiratory diseases: Secondary | ICD-10-CM | POA: Diagnosis not present

## 2016-07-13 DIAGNOSIS — E860 Dehydration: Secondary | ICD-10-CM | POA: Diagnosis not present

## 2016-07-13 DIAGNOSIS — Z90711 Acquired absence of uterus with remaining cervical stump: Secondary | ICD-10-CM

## 2016-07-13 DIAGNOSIS — J939 Pneumothorax, unspecified: Secondary | ICD-10-CM | POA: Diagnosis not present

## 2016-07-13 DIAGNOSIS — G9341 Metabolic encephalopathy: Secondary | ICD-10-CM | POA: Diagnosis present

## 2016-07-13 DIAGNOSIS — Z515 Encounter for palliative care: Secondary | ICD-10-CM | POA: Diagnosis not present

## 2016-07-13 DIAGNOSIS — K589 Irritable bowel syndrome without diarrhea: Secondary | ICD-10-CM | POA: Diagnosis present

## 2016-07-13 DIAGNOSIS — K449 Diaphragmatic hernia without obstruction or gangrene: Secondary | ICD-10-CM | POA: Diagnosis not present

## 2016-07-13 DIAGNOSIS — Z800 Family history of malignant neoplasm of digestive organs: Secondary | ICD-10-CM | POA: Diagnosis not present

## 2016-07-13 DIAGNOSIS — D649 Anemia, unspecified: Secondary | ICD-10-CM

## 2016-07-13 DIAGNOSIS — R1314 Dysphagia, pharyngoesophageal phase: Secondary | ICD-10-CM | POA: Diagnosis present

## 2016-07-13 DIAGNOSIS — J9622 Acute and chronic respiratory failure with hypercapnia: Secondary | ICD-10-CM | POA: Diagnosis not present

## 2016-07-13 DIAGNOSIS — Z938 Other artificial opening status: Secondary | ICD-10-CM

## 2016-07-13 DIAGNOSIS — E861 Hypovolemia: Secondary | ICD-10-CM | POA: Diagnosis present

## 2016-07-13 DIAGNOSIS — R109 Unspecified abdominal pain: Secondary | ICD-10-CM | POA: Diagnosis not present

## 2016-07-13 DIAGNOSIS — E43 Unspecified severe protein-calorie malnutrition: Secondary | ICD-10-CM | POA: Diagnosis present

## 2016-07-13 DIAGNOSIS — R1310 Dysphagia, unspecified: Secondary | ICD-10-CM | POA: Diagnosis not present

## 2016-07-13 DIAGNOSIS — J4530 Mild persistent asthma, uncomplicated: Secondary | ICD-10-CM | POA: Diagnosis not present

## 2016-07-13 DIAGNOSIS — D638 Anemia in other chronic diseases classified elsewhere: Secondary | ICD-10-CM | POA: Diagnosis present

## 2016-07-13 DIAGNOSIS — J9690 Respiratory failure, unspecified, unspecified whether with hypoxia or hypercapnia: Secondary | ICD-10-CM | POA: Diagnosis not present

## 2016-07-13 DIAGNOSIS — R64 Cachexia: Secondary | ICD-10-CM | POA: Diagnosis present

## 2016-07-13 LAB — COMPREHENSIVE METABOLIC PANEL
ALT: 16 U/L (ref 14–54)
ANION GAP: 12 (ref 5–15)
AST: 22 U/L (ref 15–41)
Albumin: 3.8 g/dL (ref 3.5–5.0)
Alkaline Phosphatase: 80 U/L (ref 38–126)
BUN: 52 mg/dL — ABNORMAL HIGH (ref 6–20)
CO2: 27 mmol/L (ref 22–32)
Calcium: 9.1 mg/dL (ref 8.9–10.3)
Chloride: 103 mmol/L (ref 101–111)
Creatinine, Ser: 1.4 mg/dL — ABNORMAL HIGH (ref 0.44–1.00)
GFR calc non Af Amer: 33 mL/min — ABNORMAL LOW (ref >60)
GFR, EST AFRICAN AMERICAN: 39 mL/min — AB (ref >60)
Glucose, Bld: 102 mg/dL — ABNORMAL HIGH (ref 65–99)
POTASSIUM: 2.7 mmol/L — AB (ref 3.5–5.1)
Sodium: 142 mmol/L (ref 135–145)
Total Bilirubin: 1.3 mg/dL — ABNORMAL HIGH (ref 0.3–1.2)
Total Protein: 7 g/dL (ref 6.5–8.1)

## 2016-07-13 LAB — URINALYSIS, ROUTINE W REFLEX MICROSCOPIC
BILIRUBIN URINE: NEGATIVE
Glucose, UA: NEGATIVE mg/dL
Hgb urine dipstick: NEGATIVE
KETONES UR: 5 mg/dL — AB
Nitrite: NEGATIVE
PROTEIN: NEGATIVE mg/dL
Specific Gravity, Urine: 1.015 (ref 1.005–1.030)
pH: 6 (ref 5.0–8.0)

## 2016-07-13 LAB — MAGNESIUM: Magnesium: 2.2 mg/dL (ref 1.7–2.4)

## 2016-07-13 LAB — CBC
HEMATOCRIT: 32.5 % — AB (ref 36.0–46.0)
HEMOGLOBIN: 10.7 g/dL — AB (ref 12.0–15.0)
MCH: 28.2 pg (ref 26.0–34.0)
MCHC: 32.9 g/dL (ref 30.0–36.0)
MCV: 85.5 fL (ref 78.0–100.0)
Platelets: 252 10*3/uL (ref 150–400)
RBC: 3.8 MIL/uL — AB (ref 3.87–5.11)
RDW: 13.9 % (ref 11.5–15.5)
WBC: 8.1 10*3/uL (ref 4.0–10.5)

## 2016-07-13 LAB — LIPASE, BLOOD: LIPASE: 35 U/L (ref 11–51)

## 2016-07-13 MED ORDER — SODIUM CHLORIDE 0.9 % IV BOLUS (SEPSIS)
1000.0000 mL | Freq: Once | INTRAVENOUS | Status: AC
  Administered 2016-07-13: 1000 mL via INTRAVENOUS

## 2016-07-13 MED ORDER — HYDRALAZINE HCL 20 MG/ML IJ SOLN
5.0000 mg | Freq: Four times a day (QID) | INTRAMUSCULAR | Status: DC | PRN
Start: 2016-07-13 — End: 2016-07-13
  Filled 2016-07-13: qty 0.25

## 2016-07-13 MED ORDER — FAMOTIDINE IN NACL 20-0.9 MG/50ML-% IV SOLN: 20.0000 mg | Freq: Every day | INTRAVENOUS | Status: DC

## 2016-07-13 MED ORDER — ACETAMINOPHEN 325 MG PO TABS: 650.0000 mg | ORAL_TABLET | Freq: Four times a day (QID) | ORAL | Status: DC | PRN

## 2016-07-13 MED ORDER — POTASSIUM CHLORIDE CRYS ER 20 MEQ PO TBCR
40.0000 meq | EXTENDED_RELEASE_TABLET | Freq: Once | ORAL | Status: AC
  Administered 2016-07-13: 40 meq via ORAL
  Filled 2016-07-13: qty 2

## 2016-07-13 MED ORDER — POTASSIUM CHLORIDE 10 MEQ/100ML IV SOLN
10.0000 meq | Freq: Once | INTRAVENOUS | Status: AC
  Administered 2016-07-13: 10 meq via INTRAVENOUS
  Filled 2016-07-13: qty 100

## 2016-07-13 MED ORDER — ACETAMINOPHEN 650 MG RE SUPP
650.0000 mg | Freq: Four times a day (QID) | RECTAL | Status: DC | PRN
  Administered 2016-07-21: 650 mg via RECTAL
  Filled 2016-07-13: qty 1

## 2016-07-13 MED ORDER — DULOXETINE HCL 20 MG PO CPEP
20.0000 mg | ORAL_CAPSULE | Freq: Every day | ORAL | Status: DC
  Administered 2016-07-14 – 2016-07-20 (×6): 20 mg via ORAL
  Filled 2016-07-13 (×12): qty 1

## 2016-07-13 MED ORDER — VITAMIN B-12 1000 MCG PO TABS
1000.0000 ug | ORAL_TABLET | Freq: Every day | ORAL | Status: DC
  Administered 2016-07-14 – 2016-07-16 (×2): 1000 ug via ORAL
  Filled 2016-07-13 (×2): qty 1

## 2016-07-13 MED ORDER — BISACODYL 5 MG PO TBEC: 5.0000 mg | DELAYED_RELEASE_TABLET | Freq: Every day | ORAL | Status: DC | PRN

## 2016-07-13 MED ORDER — SODIUM CHLORIDE 0.9% FLUSH
3.0000 mL | Freq: Two times a day (BID) | INTRAVENOUS | Status: DC
  Administered 2016-07-15 – 2016-07-24 (×8): 3 mL via INTRAVENOUS

## 2016-07-13 MED ORDER — HYDRALAZINE HCL 20 MG/ML IJ SOLN
5.0000 mg | Freq: Four times a day (QID) | INTRAMUSCULAR | Status: DC | PRN
  Administered 2016-07-14: 5 mg via INTRAVENOUS
  Filled 2016-07-13 (×2): qty 0.25

## 2016-07-13 MED ORDER — MOMETASONE FURO-FORMOTEROL FUM 100-5 MCG/ACT IN AERO
2.0000 | INHALATION_SPRAY | Freq: Two times a day (BID) | RESPIRATORY_TRACT | Status: DC
  Administered 2016-07-14 – 2016-07-18 (×3): 2 via RESPIRATORY_TRACT
  Filled 2016-07-13: qty 8.8

## 2016-07-13 MED ORDER — ONDANSETRON HCL 4 MG PO TABS: 4.0000 mg | ORAL_TABLET | Freq: Four times a day (QID) | ORAL | Status: DC | PRN

## 2016-07-13 MED ORDER — POTASSIUM CHLORIDE IN NACL 20-0.9 MEQ/L-% IV SOLN
INTRAVENOUS | Status: AC
  Administered 2016-07-14: via INTRAVENOUS
  Filled 2016-07-13: qty 1000

## 2016-07-13 MED ORDER — CALCIUM CARBONATE-VITAMIN D 500-200 MG-UNIT PO TABS
2.0000 | ORAL_TABLET | Freq: Every day | ORAL | Status: DC
  Administered 2016-07-14 – 2016-07-23 (×5): 2 via ORAL
  Filled 2016-07-13: qty 1
  Filled 2016-07-13: qty 2
  Filled 2016-07-13: qty 1
  Filled 2016-07-13 (×3): qty 2

## 2016-07-13 MED ORDER — POLYETHYLENE GLYCOL 3350 17 G PO PACK: 17.0000 g | PACK | Freq: Every day | ORAL | Status: DC | PRN

## 2016-07-13 MED ORDER — HYDROCODONE-ACETAMINOPHEN 5-325 MG PO TABS
1.0000 | ORAL_TABLET | ORAL | Status: DC | PRN
  Administered 2016-07-14: 1 via ORAL
  Administered 2016-07-16 – 2016-07-18 (×2): 2 via ORAL
  Filled 2016-07-13 (×2): qty 1
  Filled 2016-07-13 (×3): qty 2

## 2016-07-13 MED ORDER — ONDANSETRON HCL 4 MG/2ML IJ SOLN: 4.0000 mg | Freq: Four times a day (QID) | INTRAMUSCULAR | Status: DC | PRN

## 2016-07-13 MED ORDER — MAGNESIUM SULFATE IN D5W 1-5 GM/100ML-% IV SOLN
1.0000 g | Freq: Once | INTRAVENOUS | Status: AC
  Administered 2016-07-14: 1 g via INTRAVENOUS
  Filled 2016-07-13 (×2): qty 100

## 2016-07-13 MED ORDER — FAMOTIDINE IN NACL 20-0.9 MG/50ML-% IV SOLN
20.0000 mg | Freq: Every day | INTRAVENOUS | Status: DC
  Administered 2016-07-14 – 2016-07-16 (×3): 20 mg via INTRAVENOUS
  Filled 2016-07-13 (×3): qty 50

## 2016-07-13 MED ORDER — HEPARIN SODIUM (PORCINE) 5000 UNIT/ML IJ SOLN
5000.0000 [IU] | Freq: Three times a day (TID) | INTRAMUSCULAR | Status: DC
  Administered 2016-07-14 – 2016-07-23 (×26): 5000 [IU] via SUBCUTANEOUS
  Filled 2016-07-13 (×27): qty 1

## 2016-07-13 NOTE — ED Triage Notes (Signed)
Patient states she went to the lab today and was told she had dehydration. Patient states she has been vomiting at night x 3 weeks. Patient states she has a hiatal hernia

## 2016-07-13 NOTE — H&P (Signed)
History and Physical    Kelly Drake [id number] DOB: 1930/03/15 DOA: 08/01/2016  PCP: Rusty Aus, MD   Patient coming from: Home  Chief Complaint: Dysphagia, N/V, weight-loss   HPI: Kelly Drake is a 83 y.o. female with medical history significant for emphysema, B12 deficiency, hiatal hernia, and GERD, now presenting to the emergency department at the direction of her primary care physician for evaluation of dysphagia to solids with nausea, vomiting, and progressive weight loss. The patient describes a long-standing history of indigestion and acid reflux for which she has been taking Nexium daily at home, but notes that it was never really brought to her PCPs attention until she began developing dysphasia to solids approximately 3 weeks ago. Since that time, any attempt at eating is met with immediate discomfort in the central chest, burning sensation, and nausea with vomiting. She denies any cough or dyspnea and denies any fevers or chills. She reports that she is able to tolerate liquids without choking or coughing, and without pain. She was evaluated by her PCP for this, determined to have a hiatal hernia which was suspected to be the culprit, and she was referred to surgery. She has surgical repair of the hiatal hernia is scheduled for 07/24/2016. She saw her PCP on 07/11/2016 for preoperative evaluation, reported that she has not been eating or drinking much of anything for the past 3 weeks, and was sent for blood work which returned concerning for dehydration. There was an acute kidney injury and critical hypokalemia. Patient was contacted and directed to the ED.  ED Course: Upon arrival to the ED, patient is found to be afebrile, saturating well on room air, and with vital signs stable. EKG features a sinus rhythm with a left B and QTc interval of 672 ms. Chemistry panels notable for a potassium of 2.7 BUN of 52, and serum creatinine 1.40, up from an apparent baseline of 0.7. CBC is  notable for a stable normocytic anemia with hemoglobin of 10.7. Patient was treated with 2 L of normal saline, 40 mEq of oral potassium, and 10 mEq of IV potassium in the ED. She remained hemodynamically stable and without any respiratory distress. She will be admitted to the telemetry unit for ongoing evaluation and management of dysphasia to solids with marked dehydration and resulting acute kidney injury and critical electrolyte derangements.  Review of Systems:  All other systems reviewed and apart from HPI, are negative.  Past Medical History:  Diagnosis Date  . Asthma   . B12 deficiency   . Bladder cancer ([place])   . COPD (chronic obstructive pulmonary disease) ([place])   . Cystitis   . Fibrocystic breast disease   . GERD (gastroesophageal reflux disease)   . Hematuria   . History of hiatal hernia   . Hyperlipidemia   . Hypertension   . IBS (irritable bowel syndrome)   . Osteoporosis     Past Surgical History:  Procedure Laterality Date  . BREAST CYST EXCISION  1964  . ESOPHAGOGASTRODUODENOSCOPY (EGD) WITH PROPOFOL N/A 11/04/2015   Procedure: ESOPHAGOGASTRODUODENOSCOPY (EGD) WITH PROPOFOL;  Surgeon: Manya Silvas, MD;  Location[facility] ENDOSCOPY;  Service: Endoscopy;  Laterality: N/A;  . PARTIAL HYSTERECTOMY  1979  . SAVORY DILATION N/A 11/04/2015   Procedure: SAVORY DILATION;  Surgeon: Manya Silvas, MD;  Location: [facility] ENDOSCOPY;  Service: Endoscopy;  Laterality: N/A;     reports that she quit smoking about 54 years ago. Her smoking use included Cigarettes. She has a 1.50  pack-year smoking history. She has never used smokeless tobacco. She reports that she does not drink alcohol or use drugs.  Allergies  Allergen Reactions  . Sulfonamide Derivatives     Family History  Problem Relation Age of Onset  . Allergies Mother   . Colon cancer Father   . Emphysema Brother      Prior to Admission medications   Medication Sig Start Date End Date Taking? Authorizing Provider    budesonide-formoterol (SYMBICORT) 80-4.5 MCG/ACT inhaler Inhale 2 puffs into the lungs 2 (two) times daily.   Yes [provider]  Calcium Carbonate-Vitamin D (CALCIUM 600+D) 600-400 MG-UNIT per tablet Take 2 tablets by mouth daily.    Yes [provider]  cholecalciferol (VITAMIN D) 1000 UNITS tablet Take 1,000 Units by mouth daily.     Yes [provider]  Cyanocobalamin (RA VITAMIN B-12 TR) 1000 MCG TBCR Take 1 tablet by mouth daily.    Yes [provider]  DULoxetine (CYMBALTA) 20 MG capsule Take 20 mg by mouth daily.  04/21/14 07/11/2016 Yes [provider]  esomeprazole (NEXIUM) 20 MG capsule Take 20 mg by mouth daily at 12 noon.    Yes [provider]  etodolac (LODINE) 300 MG capsule Take 300 mg by mouth 2 (two) times daily.  03/14/15  Yes [provider]  ondansetron (ZOFRAN) 4 MG tablet Take 4 mg by mouth 3 (three) times daily as needed for nausea/vomiting. 07/03/16  Yes [provider]  albuterol (PROVENTIL HFA;VENTOLIN HFA) 108 (90 Base) MCG/ACT inhaler Inhale 2 puffs into the lungs every 6 (six) hours as needed for wheezing or shortness of breath. Patient not taking: Reported on 07/10/2016 07/07/15   Flora Lipps, MD  Spacer/Aero-Holding Chambers (AEROCHAMBER MV) inhaler Use as instructed 07/08/14   Juanito Doom, MD    Physical Exam: Vitals:   07/30/2016 1857 07/17/2016 2019 07/08/2016 2021 08/01/2016 2030  BP:  (!) 163/96  (!) 150/94  Pulse:   72 69  Resp:      Temp:      TempSrc:      SpO2:   100% 100%  Weight: 42.2 kg (93 lb)     Height: _0  (1.499 m)         Constitutional: NAD, calm, cachectic.  Eyes: PERTLA, lids and conjunctivae normal ENMT: Mucous membranes are moist. Posterior pharynx clear of any exudate or lesions.   Neck: normal, supple, no masses, no thyromegaly Respiratory: clear to auscultation bilaterally, no wheezing, no crackles. Normal respiratory effort.   Cardiovascular: S1 & S2 heard,  regular rate and rhythm. No extremity edema. No significant JVD. Abdomen: No distension, no tenderness, no masses palpated. Bowel sounds active.  Musculoskeletal: no clubbing / cyanosis. No joint deformity upper and lower extremities.   Skin: no significant rashes, lesions, ulcers. Warm, dry, well-perfused. Poor turgor.  Neurologic: CN 2-12 grossly intact. Sensation intact, DTR normal. Strength 5/5 in all 4 limbs.  Psychiatric:  Alert and oriented x 3. Pleasant and cooperative.     Labs on Admission: I have personally reviewed following labs and imaging studies  CBC:  Recent Labs Lab 07/09/2016 2020  WBC 8.1  HGB 10.7*  HCT 32.5*  MCV 85.5  PLT 211   Basic Metabolic Panel:  Recent Labs Lab 07/16/2016 2020  NA 142  K 2.7*  CL 103  CO2 27  GLUCOSE 102*  BUN 52*  CREATININE 1.40*  CALCIUM 9.1  MG 2.2   GFR: Estimated Creatinine Clearance: 19.9 mL/min (A) (  by C-G formula based on SCr of 1.4 mg/dL (H)). Liver Function Tests:  Recent Labs Lab 07/19/2016 2020  AST 22  ALT 16  ALKPHOS 80  BILITOT 1.3*  PROT 7.0  ALBUMIN 3.8    Recent Labs Lab 07/20/2016 2020  LIPASE 35   No results for input(s): AMMONIA in the last 168 hours. Coagulation Profile: No results for input(s): INR, PROTIME in the last 168 hours. Cardiac Enzymes: No results for input(s): CKTOTAL, CKMB, CKMBINDEX, TROPONINI in the last 168 hours. BNP (last 3 results) No results for input(s): PROBNP in the last 8760 hours. HbA1C: No results for input(s): HGBA1C in the last 72 hours. CBG: No results for input(s): GLUCAP in the last 168 hours. Lipid Profile: No results for input(s): CHOL, HDL, LDLCALC, TRIG, CHOLHDL, LDLDIRECT in the last 72 hours. Thyroid Function Tests: No results for input(s): TSH, T4TOTAL, FREET4, T3FREE, THYROIDAB in the last 72 hours. Anemia Panel: No results for input(s): VITAMINB12, FOLATE, FERRITIN, TIBC, IRON, RETICCTPCT in the last 72 hours. Urine analysis:    Component  Value Date/Time   APPEARANCEUR Clear 04/13/2016 0858   GLUCOSEU Negative 04/13/2016 0858   BILIRUBINUR Negative 04/13/2016 0858   PROTEINUR Negative 04/13/2016 0858   NITRITE Negative 04/13/2016 0858   LEUKOCYTESUR Negative 04/13/2016 0858   Sepsis Labs: _0 (procalcitonin:4,lacticidven:4) )No results found for this or any previous visit (from the past 240 hour(s)).   Radiological Exams on Admission: No results found.  EKG: Independently reviewed. Sinus rhythm, LAFB, QTc interval 672.   Assessment/Plan  1. Dysphagia to solids   - Pt presents at the direction of her PCP for dehydration secondary to dysphagia  - This has been attributed to a hiatal hernia, she was referred to surgery, and has repair scheduled for 07/24/16  - On preoperative eval, she reported her inability to tolerate solids and her ongoing N/V; she was noted to have 7-lb wt-loss, and labs revealed AKI and critical hypokalemia - She was treated with IVF and potassium in ED, has been tolerating liquids well  - Plan to continue with full liquid diet, IVF hydration, and address AKI and electrolytes as below  - Patient has been in discussion with her surgeon, Dr. Sabra Heck, regarding moving up the OR date    2. Acute kidney injury  - SCr is 1.40 on admission, up from an apparent baseline of 0.7  - Likely a prerenal azotemia in setting of dysphagia and wt-loss; she also uses etodolac BID  - She was given 2 liters NS in ED - Plan to hold NSAIDs, continue IVF hydration, repeat chem panel in am    3. Hypokalemia - Serum potassium is 2.7 on admission with prolonged QT - Magnesium is wnl at 2.2  - She was treated with 40 mEq oral potassium (tolerated crushed in apple sauce) and 10 mEq IV potassium  - Monitor on telemetry, continue KCl in IVF  - Repeat chemistries in am    4. COPD - Stable  - Continue scheduled ICS/LABA    5. Normocytic anemia  - Hgb is 10.7 on admission and stable  - No bleeding  - Monitor with  periodic CBC - Continue B12 supplementation   6. Prolonged QT interval  - OTc is prolonged to 672 ms on admission in setting of critical hypokalemia  - Potassium is replaced as above - Continue telemetry monitoring - Avoid prolonging agents   DVT prophylaxis: sq heparin  Code Status: Full  Family Communication: Son updated at bedside Disposition Plan: Admit to  telemetry Consults called: None Admission status: Inpatient    Vianne Bulls, MD [facility] Pager [phone]  If 7PM-7AM, please contact night-coverage [email or web address] Password [email or web address]  07/20/2016, 10:14 PM

## 2016-07-13 NOTE — ED Notes (Addendum)
Pt is aware that we need urine sample she said  will try later

## 2016-07-13 NOTE — ED Provider Notes (Signed)
[facility] DEPT Provider Note   CSN: [id number] Arrival date & time: 08/01/2016  1848     History   Chief Complaint Chief Complaint  Patient presents with  . Emesis  . Abnormal Lab  . Abdominal Pain    HPI Kelly Drake is a 83 y.o. female.  HPI    Kelly Drake is a 83 y.o. female, with a history of Hiatal hernia, GERD, COPD, HTN, presenting to the ED with vomiting for the past month. Has a hiatal hernia and she thinks this has caused her to regurgitate food. Has baseline epigastric pain, 7/10, radiating into lower chest, worse with laying flat and after eating. Saw her PCP for a preop check up and labs. Labs resulted today. PCP told her to come to the ED due to abnormal labs. Endorses generalized weakness, nausea, and anorexia. Poor oral intake. Last BM 5 days ago, but states she hasn't been eating much of anything. Dr. Hassell Done scheduled to perform surgery on June 19.  Denies fever/chills, cough,       Past Medical History:  Diagnosis Date  . Asthma   . B12 deficiency   . Bladder cancer ([place])   . COPD (chronic obstructive pulmonary disease) ([place])   . Cystitis   . Fibrocystic breast disease   . GERD (gastroesophageal reflux disease)   . Hematuria   . History of hiatal hernia   . Hyperlipidemia   . Hypertension   . IBS (irritable bowel syndrome)   . Osteoporosis     Patient Active Problem List   Diagnosis Date Noted  . Combined fat and carbohydrate induced hyperlipemia 09/08/2014  . Swelling of both lips 07/08/2014  . Closed wedge compression fracture of thoracic vertebra ([place]) 04/21/2014  . Adult idiopathic generalized osteoporosis 12/02/2013  . Asthma, persistent controlled 12/02/2013  . B12 deficiency 07/26/2013  . History of neoplasm of bladder 03/26/2013  . Exertional chest pain 01/01/2013  . Infection of urinary tract 04/11/2010  . CANDIDIASIS, ORAL 11/24/2009  . Mixed incontinence 05/20/2009  . Prolapse of vaginal vault after hysterectomy  05/20/2009  . Malignant neoplasm of lateral wall of urinary bladder ([place]) 03/08/2009  . Microscopic hematuria 02/07/2009  . Bladder infection, chronic 01/20/2009  . HYPERTENSION, BENIGN 06/24/2008  . COPD with emphysema ([place]) 01/06/2008  . G E R D 01/06/2008    Past Surgical History:  Procedure Laterality Date  . BREAST CYST EXCISION  1964  . ESOPHAGOGASTRODUODENOSCOPY (EGD) WITH PROPOFOL N/A 11/04/2015   Procedure: ESOPHAGOGASTRODUODENOSCOPY (EGD) WITH PROPOFOL;  Surgeon: Manya Silvas, MD;  Location: [facility] ENDOSCOPY;  Service: Endoscopy;  Laterality: N/A;  . PARTIAL HYSTERECTOMY  1979  . SAVORY DILATION N/A 11/04/2015   Procedure: SAVORY DILATION;  Surgeon: Manya Silvas, MD;  Location: [facility] ENDOSCOPY;  Service: Endoscopy;  Laterality: N/A;    OB History    Gravida Para Term Preterm AB Living   1         1   SAB TAB Ectopic Multiple Live Births                   Home Medications    Prior to Admission medications   Medication Sig Start Date End Date Taking? Authorizing Provider  budesonide-formoterol (SYMBICORT) 80-4.5 MCG/ACT inhaler Inhale 2 puffs into the lungs 2 (two) times daily.   Yes [provider]  Calcium Carbonate-Vitamin D (CALCIUM 600+D) 600-400 MG-UNIT per tablet Take 2 tablets by mouth daily.    Yes [provider]  cholecalciferol (VITAMIN  D) 1000 UNITS tablet Take 1,000 Units by mouth daily.     Yes [provider]  Cyanocobalamin (RA VITAMIN B-12 TR) 1000 MCG TBCR Take 1 tablet by mouth daily.    Yes [provider]  DULoxetine (CYMBALTA) 20 MG capsule Take 20 mg by mouth daily.  04/21/14 07/14/2016 Yes [provider]  esomeprazole (NEXIUM) 20 MG capsule Take 20 mg by mouth daily at 12 noon.    Yes [provider]  etodolac (LODINE) 300 MG capsule Take 300 mg by mouth 2 (two) times daily.  03/14/15  Yes [provider]  ondansetron (ZOFRAN) 4 MG tablet Take 4 mg by mouth 3 (three) times daily as  needed for nausea/vomiting. 07/03/16  Yes [provider]  albuterol (PROVENTIL HFA;VENTOLIN HFA) 108 (90 Base) MCG/ACT inhaler Inhale 2 puffs into the lungs every 6 (six) hours as needed for wheezing or shortness of breath. Patient not taking: Reported on 07/07/2016 07/07/15   Flora Lipps, MD  Spacer/Aero-Holding Chambers (AEROCHAMBER MV) inhaler Use as instructed 07/08/14   Juanito Doom, MD    Family History Family History  Problem Relation Age of Onset  . Allergies Mother   . Colon cancer Father   . Emphysema Brother     Social History Social History  Substance Use Topics  . Smoking status: Former Smoker    Packs/day: 0.50    Years: 3.00    Types: Cigarettes    Quit date: 02/05/1962  . Smokeless tobacco: Never Used  . Alcohol use No     Allergies   Sulfonamide derivatives   Review of Systems Review of Systems  Constitutional: Negative for chills and fever.  Respiratory: Negative for shortness of breath.   Cardiovascular: Negative for chest pain.  Gastrointestinal: Positive for abdominal pain (epigastric), nausea and vomiting.       Poor oral intake.  All other systems reviewed and are negative.    Physical Exam Updated Vital Signs BP 133/85 (BP Location: Left Arm)   Pulse 88   Temp 98.6 F (37 C) (Oral)   Resp 16   Ht 4\' 11"  (1.499 m)   Wt 42.2 kg (93 lb)   SpO2 98%   BMI 18.78 kg/m   Physical Exam  Constitutional: She appears cachectic.  HENT:  Head: Normocephalic and atraumatic.  Mouth/Throat: Mucous membranes are dry.  Eyes: Conjunctivae are normal.  Neck: Neck supple.  Cardiovascular: Normal rate, regular rhythm, normal heart sounds and intact distal pulses.   Pulmonary/Chest: Effort normal and breath sounds normal. No respiratory distress.  Abdominal: Soft. There is no tenderness. There is no guarding.  Musculoskeletal: She exhibits no edema.  Lymphadenopathy:    She has no cervical adenopathy.  Neurological: She is alert.  Skin:  Skin is warm and dry. She is not diaphoretic.  Poor skin turgor  Psychiatric: She has a normal mood and affect. Her behavior is normal.  Nursing note and vitals reviewed.    ED Treatments / Results  Labs (all labs ordered are listed, but only abnormal results are displayed) Labs Reviewed  COMPREHENSIVE METABOLIC PANEL - Abnormal; Notable for the following:       Result Value   Potassium 2.7 (*)    Glucose, Bld 102 (*)    BUN 52 (*)    Creatinine, Ser 1.40 (*)    Total Bilirubin 1.3 (*)    GFR calc non Af Amer 33 (*)    GFR calc Af Amer 39 (*)    All other  components within normal limits  CBC - Abnormal; Notable for the following:    RBC 3.80 (*)    Hemoglobin 10.7 (*)    HCT 32.5 (*)    All other components within normal limits  LIPASE, BLOOD  URINALYSIS, ROUTINE W REFLEX MICROSCOPIC  MAGNESIUM    EKG  EKG Interpretation None       Radiology No results found.  Procedures Procedures (including critical care time)  Medications Ordered in ED Medications  potassium chloride 10 mEq in 100 mL IVPB (10 mEq Intravenous New Bag/Given 07/15/2016 2110)  sodium chloride 0.9 % bolus 1,000 mL (1,000 mLs Intravenous New Bag/Given 07/07/2016 2019)    Followed by  sodium chloride 0.9 % bolus 1,000 mL (1,000 mLs Intravenous New Bag/Given 07/28/2016 2024)  potassium chloride SA (K-DUR,KLOR-CON) CR tablet 40 mEq (40 mEq Oral Given 07/19/2016 2110)     Initial Impression / Assessment and Plan / ED Course  I have reviewed the triage vital signs and the nursing notes.  Pertinent labs & imaging results that were available during my care of the patient were reviewed by me and considered in my medical decision making (see chart for details).  Clinical Course as of Jul 13 2144  Fri Jul 13, 2016  2143 Spoke with Dr. Myna Hidalgo, hospitalist, who agrees to admit the patient.  [SJ]    Clinical Course User Index [SJ] Jakeem Grape C, PA-C    Patient presents after being sent by her PCP for concern of  decreased kidney function. Evidence of AKI and hypokalemia on lab work. Evidence of dehydration on physical exam. Admitted for fluid and potassium replenishment.  Vitals:   07/08/2016 1857 07/31/2016 2019 07/12/2016 2021 07/12/2016 2030  BP:  (!) 163/96  (!) 150/94  Pulse:   72 69  Resp:      Temp:      TempSrc:      SpO2:   100% 100%  Weight: 42.2 kg (93 lb)     Height: 4\' 11"  (1.499 m)        Final Clinical Impressions(s) / ED Diagnoses   Final diagnoses:  AKI (acute kidney injury) ([place])  Dehydration  Hypokalemia    New Prescriptions New Prescriptions   No medications on file     Layla Maw 07/20/2016 2148    Gareth Morgan, MD 07/14/16 1402

## 2016-07-14 ENCOUNTER — Inpatient Hospital Stay (HOSPITAL_COMMUNITY): Payer: PPO

## 2016-07-14 ENCOUNTER — Encounter (HOSPITAL_COMMUNITY): Payer: Self-pay

## 2016-07-14 DIAGNOSIS — N179 Acute kidney failure, unspecified: Secondary | ICD-10-CM

## 2016-07-14 LAB — BASIC METABOLIC PANEL
ANION GAP: 11 (ref 5–15)
BUN: 30 mg/dL — ABNORMAL HIGH (ref 6–20)
CO2: 24 mmol/L (ref 22–32)
Calcium: 8 mg/dL — ABNORMAL LOW (ref 8.9–10.3)
Chloride: 109 mmol/L (ref 101–111)
Creatinine, Ser: 0.97 mg/dL (ref 0.44–1.00)
GFR calc Af Amer: >60 mL/min (ref >60)
GFR calc non Af Amer: 52 mL/min — ABNORMAL LOW (ref >60)
GLUCOSE: 90 mg/dL (ref 65–99)
POTASSIUM: 2.8 mmol/L — AB (ref 3.5–5.1)
Sodium: 144 mmol/L (ref 135–145)

## 2016-07-14 LAB — CBC
HEMATOCRIT: 30.4 % — AB (ref 36.0–46.0)
Hemoglobin: 10.2 g/dL — ABNORMAL LOW (ref 12.0–15.0)
MCH: 28.7 pg (ref 26.0–34.0)
MCHC: 33.6 g/dL (ref 30.0–36.0)
MCV: 85.6 fL (ref 78.0–100.0)
Platelets: 255 10*3/uL (ref 150–400)
RBC: 3.55 MIL/uL — ABNORMAL LOW (ref 3.87–5.11)
RDW: 13.9 % (ref 11.5–15.5)
WBC: 9.5 10*3/uL (ref 4.0–10.5)

## 2016-07-14 MED ORDER — ENSURE ENLIVE PO LIQD
237.0000 mL | Freq: Two times a day (BID) | ORAL | Status: DC
  Administered 2016-07-16 – 2016-07-19 (×7): 237 mL via ORAL

## 2016-07-14 MED ORDER — ONDANSETRON HCL 4 MG/2ML IJ SOLN
4.0000 mg | Freq: Four times a day (QID) | INTRAMUSCULAR | Status: DC | PRN
  Administered 2016-07-18 – 2016-07-21 (×6): 4 mg via INTRAVENOUS
  Filled 2016-07-14 (×7): qty 2

## 2016-07-14 MED ORDER — POTASSIUM CHLORIDE IN NACL 20-0.9 MEQ/L-% IV SOLN
INTRAVENOUS | Status: DC
  Administered 2016-07-14: 14:00:00 via INTRAVENOUS
  Administered 2016-07-15: 100 mL/h via INTRAVENOUS
  Filled 2016-07-14 (×2): qty 1000

## 2016-07-14 MED ORDER — POTASSIUM CHLORIDE 10 MEQ/100ML IV SOLN
10.0000 meq | INTRAVENOUS | Status: AC
  Administered 2016-07-14 (×4): 10 meq via INTRAVENOUS
  Filled 2016-07-14 (×4): qty 100

## 2016-07-14 NOTE — Progress Notes (Signed)
PROGRESS NOTE    Kelly Drake  TXM:[id number] DOB: 1931-01-08 DOA: 08/02/2016 PCP: Rusty Aus, MD    Brief Narrative:  Kelly Drake is a 83 y.o. female with medical history significant for emphysema, B12 deficiency, hiatal hernia, and GERD, now presenting to the emergency department at the direction of her primary care physician for evaluation of dysphagia to solids with nausea, vomiting, and progressive weight loss.   Assessment & Plan:   Principal Problem:   Acute kidney injury ([place]) Active Problems:   HYPERTENSION, BENIGN   COPD with emphysema (HCC)   Hypokalemia   Dysphagia   Normocytic anemia   Hiatal hernia   Prolonged QT interval  1-Dysphagia; Persistent Nausea , vomiting. Related to large hiatal hernia;  IV fluids.  Diet as tolerated.  IV Pepcid.  Sx consulted. Patient su[pose to have sx on 19.  QT resolved. Will add Zofran  Will check KUB.   2-Hypokalemia; will add KCL to IV fluids.  K-CL iv runs.   3-Prolong QT; correct electrolytes.  Repeat EKG.   4-AKI; due to hypovolemia, poor oral intake.  Improved with IV fluids.   5-COPD;  Continue scheduled ICS/LABA     6-Normocytic anemia;  Continue B12 supplementation  Hb stable.   DVT prophylaxis: Heparin.  Code Status: full code.  Family Communication: care discussed with patient.  Disposition Plan: remain inpatient for IV fluids.    Consultants:   Surgery    Procedures: none   Antimicrobials: none   Subjective: Still with nausea and vomiting. Unable to keep anything down for 3 weeks.   Objective: Vitals:   07/25/2016 2215 07/28/2016 2249 07/14/16 0442 07/14/16 0855  BP:  (!) 174/97 128/73   Pulse: 71 75 81   Resp:   20   Temp:  97.9 F (36.6 C) 98.6 F (37 C)   TempSrc:  Oral Oral   SpO2: 100% 100% 99% 97%  Weight:  41.8 kg (92 lb 2.4 oz) 40.8 kg (89 lb 15.2 oz)   Height:  4\' 11"  (1.499 m)      Intake/Output Summary (Last 24 hours) at 07/14/16 1111 Last data filed at  07/14/16 0956  Gross per 24 hour  Intake          3053.33 ml  Output             1050 ml  Net          2003.33 ml   Filed Weights   08/04/2016 1857 07/28/2016 2249 07/14/16 0442  Weight: 42.2 kg (93 lb) 41.8 kg (92 lb 2.4 oz) 40.8 kg (89 lb 15.2 oz)    Examination:  General exam: Appears calm and comfortable , cachetic.  Respiratory system: Clear to auscultation. Respiratory effort normal. Cardiovascular system: S1 & S2 heard, RRR. No JVD, murmurs, rubs, gallops or clicks. No pedal edema. Gastrointestinal system: Abdomen is nondistended, soft and nontender. No organomegaly or masses felt. Normal bowel sounds heard. Central nervous system: Alert and oriented. No focal neurological deficits. Extremities: Symmetric 5 x 5 power. Skin: No rashes, lesions or ulcers Psychiatry: Judgement and insight appear normal. Mood & affect appropriate.     Data Reviewed: I have personally reviewed following labs and imaging studies  CBC:  Recent Labs Lab 07/14/2016 2020 07/14/16 0523  WBC 8.1 9.5  HGB 10.7* 10.2*  HCT 32.5* 30.4*  MCV 85.5 85.6  PLT 252 482   Basic Metabolic Panel:  Recent Labs Lab 07/06/2016 2020 07/14/16 0523  NA 142 144  K 2.7* 2.8*  CL 103 109  CO2 27 24  GLUCOSE 102* 90  BUN 52* 30*  CREATININE 1.40* 0.97  CALCIUM 9.1 8.0*  MG 2.2  --    GFR: Estimated Creatinine Clearance: 27.8 mL/min (by C-G formula based on SCr of 0.97 mg/dL). Liver Function Tests:  Recent Labs Lab 07/11/2016 2020  AST 22  ALT 16  ALKPHOS 80  BILITOT 1.3*  PROT 7.0  ALBUMIN 3.8    Recent Labs Lab 08/02/2016 2020  LIPASE 35   No results for input(s): AMMONIA in the last 168 hours. Coagulation Profile: No results for input(s): INR, PROTIME in the last 168 hours. Cardiac Enzymes: No results for input(s): CKTOTAL, CKMB, CKMBINDEX, TROPONINI in the last 168 hours. BNP (last 3 results) No results for input(s): PROBNP in the last 8760 hours. HbA1C: No results for input(s): HGBA1C  in the last 72 hours. CBG: No results for input(s): GLUCAP in the last 168 hours. Lipid Profile: No results for input(s): CHOL, HDL, LDLCALC, TRIG, CHOLHDL, LDLDIRECT in the last 72 hours. Thyroid Function Tests: No results for input(s): TSH, T4TOTAL, FREET4, T3FREE, THYROIDAB in the last 72 hours. Anemia Panel: No results for input(s): VITAMINB12, FOLATE, FERRITIN, TIBC, IRON, RETICCTPCT in the last 72 hours. Sepsis Labs: No results for input(s): PROCALCITON, LATICACIDVEN in the last 168 hours.  No results found for this or any previous visit (from the past 240 hour(s)).       Radiology Studies: No results found.      Scheduled Meds: . calcium-vitamin D  2 tablet Oral Daily  . DULoxetine  20 mg Oral Daily  . feeding supplement (ENSURE ENLIVE)  237 mL Oral BID BM  . heparin  5,000 Units Subcutaneous Q8H  . mometasone-formoterol  2 puff Inhalation BID  . sodium chloride flush  3 mL Intravenous Q12H  . vitamin B-12  1,000 mcg Oral Daily   Continuous Infusions: . famotidine (PEPCID) IV    . potassium chloride 10 mEq (07/14/16 1058)     LOS: 1 day    Time spent: 35 minutes.     Elmarie Shiley, MD [facility] Pager [phone]  If 7PM-7AM, please contact night-coverage [email or web address] Password [email or web address] 07/14/2016, 11:11 AM

## 2016-07-14 NOTE — Consult Note (Signed)
Reason for Consult:vomiting Referring Physician: Dr. Kevan Drake is an 83 y.o. female.  HPI: The patient is an 83 year old white female who has seen Dr. Hassell Done recently for a large hiatal hernia. She was scheduled to have this fixed in about 9 days. For the last three weeks she has had persistent nausea and vomiting. No fever or chill. No abdominal pain  Past Medical History:  Diagnosis Date  . Asthma   . B12 deficiency   . Bladder cancer ([place])   . COPD (chronic obstructive pulmonary disease) ([place])   . Cystitis   . Fibrocystic breast disease   . GERD (gastroesophageal reflux disease)   . Hematuria   . History of hiatal hernia   . Hyperlipidemia   . Hypertension   . IBS (irritable bowel syndrome)   . Osteoporosis     Past Surgical History:  Procedure Laterality Date  . BREAST CYST EXCISION  1964  . ESOPHAGOGASTRODUODENOSCOPY (EGD) WITH PROPOFOL N/A 11/04/2015   Procedure: ESOPHAGOGASTRODUODENOSCOPY (EGD) WITH PROPOFOL;  Surgeon: Kelly Silvas, MD;  [facility] ENDOSCOPY;  Service: Endoscopy;  Laterality: N/A;  . PARTIAL HYSTERECTOMY  1979  . SAVORY DILATION N/A 11/04/2015   Procedure: SAVORY DILATION;  Surgeon: Kelly Silvas, MD;  Location: [facility] ENDOSCOPY;  Service: Endoscopy;  Laterality: N/A;    Family History  Problem Relation Age of Onset  . Allergies Mother   . Colon cancer Father   . Emphysema Brother     Social History:  reports that she quit smoking about 54 years ago. Her smoking use included Cigarettes. She has a 1.50 pack-year smoking history. She has never used smokeless tobacco. She reports that she does not drink alcohol or use drugs.  Allergies:  Allergies  Allergen Reactions  . Sulfonamide Derivatives     Medications: I have reviewed the patient's current medications.  Results for orders placed or performed during the hospital encounter of 08/01/2016 (from the past 48 hour(s))  Lipase, blood     Status: None   Collection Time:  07/24/2016  8:20 PM  Result Value Ref Range   Lipase 35 11 - 51 U/L  Comprehensive metabolic panel     Status: Abnormal   Collection Time: 07/20/2016  8:20 PM  Result Value Ref Range   Sodium 142 135 - 145 mmol/L   Potassium 2.7 (LL) 3.5 - 5.1 mmol/L    Comment: CRITICAL RESULT CALLED TO, READ BACK BY AND VERIFIED WITH: A MARQUEZ RN 07/31/2016 2057 A NAVARRO    Chloride 103 101 - 111 mmol/L   CO2 27 22 - 32 mmol/L   Glucose, Bld 102 (H) 65 - 99 mg/dL   BUN 52 (H) 6 - 20 mg/dL   Creatinine, Ser 1.40 (H) 0.44 - 1.00 mg/dL   Calcium 9.1 8.9 - 10.3 mg/dL   Total Protein 7.0 6.5 - 8.1 g/dL   Albumin 3.8 3.5 - 5.0 g/dL   AST 22 15 - 41 U/L   ALT 16 14 - 54 U/L   Alkaline Phosphatase 80 38 - 126 U/L   Total Bilirubin 1.3 (H) 0.3 - 1.2 mg/dL   GFR calc non Af Amer 33 (L) >60 mL/min   GFR calc Af Amer 39 (L) >60 mL/min    Comment: (NOTE) The eGFR has been calculated using the CKD EPI equation. This calculation has not been validated in all clinical situations. eGFR's persistently <60 mL/min signify possible Chronic Kidney Disease.    Anion gap 12 5 -  15  CBC     Status: Abnormal   Collection Time: 07/22/2016  8:20 PM  Result Value Ref Range   WBC 8.1 4.0 - 10.5 K/uL   RBC 3.80 (L) 3.87 - 5.11 MIL/uL   Hemoglobin 10.7 (L) 12.0 - 15.0 g/dL   HCT 32.5 (L) 36.0 - 46.0 %   MCV 85.5 78.0 - 100.0 fL   MCH 28.2 26.0 - 34.0 pg   MCHC 32.9 30.0 - 36.0 g/dL   RDW 13.9 11.5 - 15.5 %   Platelets 252 150 - 400 K/uL  Magnesium     Status: None   Collection Time: 08/03/2016  8:20 PM  Result Value Ref Range   Magnesium 2.2 1.7 - 2.4 mg/dL  Urinalysis, Routine w reflex microscopic     Status: Abnormal   Collection Time: 07/19/2016 10:49 PM  Result Value Ref Range   Color, Urine YELLOW YELLOW   APPearance CLEAR CLEAR   Specific Gravity, Urine 1.015 1.005 - 1.030   pH 6.0 5.0 - 8.0   Glucose, UA NEGATIVE NEGATIVE mg/dL   Hgb urine dipstick NEGATIVE NEGATIVE   Bilirubin Urine NEGATIVE NEGATIVE    Ketones, ur 5 (A) NEGATIVE mg/dL   Protein, ur NEGATIVE NEGATIVE mg/dL   Nitrite NEGATIVE NEGATIVE   Leukocytes, UA TRACE (A) NEGATIVE   RBC / HPF 0-5 0 - 5 RBC/hpf   WBC, UA 0-5 0 - 5 WBC/hpf   Bacteria, UA RARE (A) NONE SEEN   Squamous Epithelial / LPF 0-5 (A) NONE SEEN   Mucous PRESENT   CBC     Status: Abnormal   Collection Time: 07/14/16  5:23 AM  Result Value Ref Range   WBC 9.5 4.0 - 10.5 K/uL   RBC 3.55 (L) 3.87 - 5.11 MIL/uL   Hemoglobin 10.2 (L) 12.0 - 15.0 g/dL   HCT 30.4 (L) 36.0 - 46.0 %   MCV 85.6 78.0 - 100.0 fL   MCH 28.7 26.0 - 34.0 pg   MCHC 33.6 30.0 - 36.0 g/dL   RDW 13.9 11.5 - 15.5 %   Platelets 255 150 - 400 K/uL  Basic metabolic panel     Status: Abnormal   Collection Time: 07/14/16  5:23 AM  Result Value Ref Range   Sodium 144 135 - 145 mmol/L   Potassium 2.8 (L) 3.5 - 5.1 mmol/L   Chloride 109 101 - 111 mmol/L   CO2 24 22 - 32 mmol/L   Glucose, Bld 90 65 - 99 mg/dL   BUN 30 (H) 6 - 20 mg/dL   Creatinine, Ser 0.97 0.44 - 1.00 mg/dL   Calcium 8.0 (L) 8.9 - 10.3 mg/dL   GFR calc non Af Amer 52 (L) >60 mL/min   GFR calc Af Amer >60 >60 mL/min    Comment: (NOTE) The eGFR has been calculated using the CKD EPI equation. This calculation has not been validated in all clinical situations. eGFR's persistently <60 mL/min signify possible Chronic Kidney Disease.    Anion gap 11 5 - 15    No results found.  Review of Systems  Constitutional: Negative.   HENT: Negative.   Eyes: Negative.   Respiratory: Negative.   Cardiovascular: Negative.   Gastrointestinal: Positive for nausea and vomiting. Negative for abdominal pain.  Genitourinary: Negative.   Musculoskeletal: Negative.   Skin: Negative.   Neurological: Negative.   Endo/Heme/Allergies: Negative.   Psychiatric/Behavioral: Negative.    Blood pressure 128/73, pulse 81, temperature 98.6 F (37 C), temperature source Oral, resp. rate  20, height 4' 11"  (1.499 m), weight 40.8 kg (89 lb 15.2 oz),  SpO2 97 %. Physical Exam  Constitutional: She is oriented to person, place, and time. She appears well-developed and well-nourished. No distress.  HENT:  Head: Normocephalic and atraumatic.  Mouth/Throat: No oropharyngeal exudate.  Eyes: Conjunctivae and EOM are normal. Pupils are equal, round, and reactive to light.  Neck: Normal range of motion. Neck supple. No tracheal deviation present.  Cardiovascular: Normal rate, regular rhythm and normal heart sounds.   Respiratory: Effort normal and breath sounds normal. No respiratory distress.  GI: Soft. Bowel sounds are normal. She exhibits no distension.  Musculoskeletal: Normal range of motion. She exhibits no edema or tenderness.  Neurological: She is alert and oriented to person, place, and time. Coordination normal.  Skin: Skin is warm and dry. No rash noted.  Psychiatric: She has a normal mood and affect. Her behavior is normal. Thought content normal.    Assessment/Plan: The patient has a large hiatal hernia with vomiting. If the vomiting persists then she may need and ng tube to decompress the stomach. She will need IV hydration and correction of electrolytes. She does not appear ill from this. Will discuss with Dr. Hassell Done next week about the timing of surgery.  TOTH III,Kelly Drake S 07/14/2016, 11:57 AM

## 2016-07-15 ENCOUNTER — Inpatient Hospital Stay (HOSPITAL_COMMUNITY): Payer: PPO

## 2016-07-15 LAB — CBC
HEMATOCRIT: 28.3 % — AB (ref 36.0–46.0)
HEMOGLOBIN: 9.3 g/dL — AB (ref 12.0–15.0)
MCH: 28.8 pg (ref 26.0–34.0)
MCHC: 32.9 g/dL (ref 30.0–36.0)
MCV: 87.6 fL (ref 78.0–100.0)
Platelets: 272 10*3/uL (ref 150–400)
RBC: 3.23 MIL/uL — ABNORMAL LOW (ref 3.87–5.11)
RDW: 14.4 % (ref 11.5–15.5)
WBC: 12.5 10*3/uL — AB (ref 4.0–10.5)

## 2016-07-15 LAB — VITAMIN B12: Vitamin B-12: 1289 pg/mL — ABNORMAL HIGH (ref 180–914)

## 2016-07-15 LAB — URINALYSIS, ROUTINE W REFLEX MICROSCOPIC
Bilirubin Urine: NEGATIVE
GLUCOSE, UA: 50 mg/dL — AB
HGB URINE DIPSTICK: NEGATIVE
Ketones, ur: 20 mg/dL — AB
LEUKOCYTES UA: NEGATIVE
Nitrite: NEGATIVE
PH: 5 (ref 5.0–8.0)
PROTEIN: NEGATIVE mg/dL
SPECIFIC GRAVITY, URINE: 1.018 (ref 1.005–1.030)

## 2016-07-15 LAB — BASIC METABOLIC PANEL
ANION GAP: 10 (ref 5–15)
BUN: 29 mg/dL — ABNORMAL HIGH (ref 6–20)
CALCIUM: 8.2 mg/dL — AB (ref 8.9–10.3)
CO2: 22 mmol/L (ref 22–32)
Chloride: 115 mmol/L — ABNORMAL HIGH (ref 101–111)
Creatinine, Ser: 0.85 mg/dL (ref 0.44–1.00)
GFR calc non Af Amer: >60 mL/min (ref >60)
GLUCOSE: 109 mg/dL — AB (ref 65–99)
POTASSIUM: 4 mmol/L (ref 3.5–5.1)
Sodium: 147 mmol/L — ABNORMAL HIGH (ref 135–145)

## 2016-07-15 LAB — AMMONIA

## 2016-07-15 LAB — TSH: TSH: 2.026 u[IU]/mL (ref 0.350–4.500)

## 2016-07-15 MED ORDER — LORAZEPAM 2 MG/ML IJ SOLN
0.5000 mg | INTRAMUSCULAR | Status: DC | PRN
  Administered 2016-07-15 (×4): 0.5 mg via INTRAVENOUS
  Administered 2016-07-16: 1 mg via INTRAVENOUS
  Filled 2016-07-15 (×5): qty 1

## 2016-07-15 MED ORDER — SODIUM CHLORIDE 0.9 % IV SOLN
1.5000 g | Freq: Four times a day (QID) | INTRAVENOUS | Status: DC
  Administered 2016-07-15 – 2016-07-19 (×16): 1.5 g via INTRAVENOUS
  Filled 2016-07-15 (×17): qty 1.5

## 2016-07-15 MED ORDER — DEXTROSE 5 % IV SOLN
INTRAVENOUS | Status: AC
  Administered 2016-07-15 – 2016-07-16 (×3): via INTRAVENOUS

## 2016-07-15 MED ORDER — DEXTROSE-NACL 5-0.9 % IV SOLN: INTRAVENOUS | Status: DC

## 2016-07-15 NOTE — Progress Notes (Signed)
PROGRESS NOTE    Kelly Drake  UXN:[id number] DOB: September 10, 1930 DOA: 08/02/2016 PCP: Rusty Aus, MD    Brief Narrative:  Kelly Drake is a 83 y.o. female with medical history significant for emphysema, B12 deficiency, hiatal hernia, and GERD, now presenting to the emergency department at the direction of her primary care physician for evaluation of dysphagia to solids with nausea, vomiting, and progressive weight loss.   Assessment & Plan:   Principal Problem:   Acute kidney injury ([place]) Active Problems:   HYPERTENSION, BENIGN   COPD with emphysema (HCC)   Hypokalemia   Dysphagia   Normocytic anemia   Hiatal hernia   Prolonged QT interval  1-Dysphagia; Persistent Nausea , vomiting. Related to large hiatal hernia;  IV fluids.  Diet as tolerated.  IV Pepcid.  Sx consulted. Patient supose to have sx on 19.  QT resolved. Zofran prn KUB; negative  2-Acute encephalopathy; Got confused overnight, fell early this morning.  Sleepy this morning; received one dose ativan for confusion.  Will check CT head, ammonia, TSH, B-12.   Hypernatremia; check fluids to D 5.  Repeat labs in am.   Leukocytosis;  Check chest x ray and UA.   Hypokalemia; resolved.   Prolong QT; correct electrolytes.  Corrected.   AKI; due to hypovolemia, poor oral intake.  Improved with IV fluids.   COPD;  Continue scheduled ICS/LABA     6-Normocytic anemia;  Continue B12 supplementation  Hb stable.   DVT prophylaxis: Heparin.  Code Status: full code.  Family Communication: care discussed with patient.  Disposition Plan: remain inpatient for IV fluids.    Consultants:   Surgery    Procedures: none   Antimicrobials: none   Subjective: Confuse overnight, fell last night. Received one dose of ativan, She is sleepy, say few word non sense.   Objective: Vitals:   07/14/16 2013 07/14/16 2042 07/15/16 0406 07/15/16 0712  BP: (!) 161/92 (!) 155/94 (!) 174/88 (!) 113/46  Pulse:  79  83 84  Resp: 18  18 18   Temp: 98.2 F (36.8 C)  97.9 F (36.6 C) 97.6 F (36.4 C)  TempSrc: Oral  Oral Axillary  SpO2: 98%  100% 99%  Weight:      Height:        Intake/Output Summary (Last 24 hours) at 07/15/16 1037 Last data filed at 07/15/16 0200  Gross per 24 hour  Intake             1570 ml  Output              200 ml  Net             1370 ml   Filed Weights   07/07/2016 1857 07/14/2016 2249 07/14/16 0442  Weight: 42.2 kg (93 lb) 41.8 kg (92 lb 2.4 oz) 40.8 kg (89 lb 15.2 oz)    Examination:  General exam: lethargic, cachetic.  Respiratory system: CTA Cardiovascular system: S 1, S 2 RRR Gastrointestinal system: BS present, soft, NT Central nervous system: sleepy, say few words.  Extremities: unable to assess strength  Skin: No rash    Data Reviewed: I have personally reviewed following labs and imaging studies  CBC:  Recent Labs Lab 07/18/2016 2020 07/14/16 0523 07/15/16 0519  WBC 8.1 9.5 12.5*  HGB 10.7* 10.2* 9.3*  HCT 32.5* 30.4* 28.3*  MCV 85.5 85.6 87.6  PLT 252 255 254   Basic Metabolic Panel:  Recent Labs Lab 07/28/2016 2020 07/14/16 0523 07/15/16  0519  NA 142 144 147*  K 2.7* 2.8* 4.0  CL 103 109 115*  CO2 27 24 22   GLUCOSE 102* 90 109*  BUN 52* 30* 29*  CREATININE 1.40* 0.97 0.85  CALCIUM 9.1 8.0* 8.2*  MG 2.2  --   --    GFR: Estimated Creatinine Clearance: 31.7 mL/min (by C-G formula based on SCr of 0.85 mg/dL). Liver Function Tests:  Recent Labs Lab 07/12/2016 2020  AST 22  ALT 16  ALKPHOS 80  BILITOT 1.3*  PROT 7.0  ALBUMIN 3.8    Recent Labs Lab 07/29/2016 2020  LIPASE 35   No results for input(s): AMMONIA in the last 168 hours. Coagulation Profile: No results for input(s): INR, PROTIME in the last 168 hours. Cardiac Enzymes: No results for input(s): CKTOTAL, CKMB, CKMBINDEX, TROPONINI in the last 168 hours. BNP (last 3 results) No results for input(s): PROBNP in the last 8760 hours. HbA1C: No results for  input(s): HGBA1C in the last 72 hours. CBG: No results for input(s): GLUCAP in the last 168 hours. Lipid Profile: No results for input(s): CHOL, HDL, LDLCALC, TRIG, CHOLHDL, LDLDIRECT in the last 72 hours. Thyroid Function Tests: No results for input(s): TSH, T4TOTAL, FREET4, T3FREE, THYROIDAB in the last 72 hours. Anemia Panel: No results for input(s): VITAMINB12, FOLATE, FERRITIN, TIBC, IRON, RETICCTPCT in the last 72 hours. Sepsis Labs: No results for input(s): PROCALCITON, LATICACIDVEN in the last 168 hours.  No results found for this or any previous visit (from the past 240 hour(s)).       Radiology Studies: Dg Abd 1 View  Result Date: 07/14/2016 CLINICAL DATA:  Vomiting today. EXAM: ABDOMEN - 1 VIEW COMPARISON:  CT of 01/26/2010. FINDINGS: Cardiomegaly. Trace right pleural fluid or thickening. No gaseous distention of bowel loops. Nonspecific paucity of large and small bowel gas. Distal stool identified. No abnormal abdominal calcifications. No appendicolith. Convex right lumbar spine curvature. IMPRESSION: No acute findings.  Nonspecific paucity of abdominopelvic bowel gas. Electronically Signed   By: Abigail Miyamoto M.D.   On: 07/14/2016 17:11        Scheduled Meds: . calcium-vitamin D  2 tablet Oral Daily  . DULoxetine  20 mg Oral Daily  . feeding supplement (ENSURE ENLIVE)  237 mL Oral BID BM  . heparin  5,000 Units Subcutaneous Q8H  . mometasone-formoterol  2 puff Inhalation BID  . sodium chloride flush  3 mL Intravenous Q12H  . vitamin B-12  1,000 mcg Oral Daily   Continuous Infusions: . dextrose    . famotidine (PEPCID) IV Stopped (07/14/16 1430)     LOS: 2 days    Time spent: 35 minutes.     Elmarie Shiley, MD [facility] Pager [phone]  If 7PM-7AM, please contact night-coverage [email or web address] Password [facility] 07/15/2016, 10:37 AM

## 2016-07-15 NOTE — Progress Notes (Signed)
Initial Nutrition Assessment  DOCUMENTATION CODES:   Underweight, Severe malnutrition in context of acute illness/injury  INTERVENTION:   Continue Ensure Enlive po BID, each supplement provides 350 kcal and 20 grams of protein Encourage PO intake RD to continue to monitor  NUTRITION DIAGNOSIS:   Malnutrition related to acute illness, nausea, vomiting as evidenced by percent weight loss, severe depletion of body fat, severe depletion of muscle mass.  GOAL:   Patient will meet greater than or equal to 90% of their needs  MONITOR:   PO intake, Supplement acceptance, Labs, Weight trends, I & O's  REASON FOR ASSESSMENT:   Malnutrition Screening Tool    ASSESSMENT:   83 y.o. female with medical history significant for emphysema, B12 deficiency, hiatal hernia, and GERD, now presenting to the emergency department at the direction of her primary care physician for evaluation of dysphagia to solids with nausea, vomiting, and progressive weight loss.  Patient in room with no family at bedside. Pt currently confused and not able to provide any history. Per chart review, pt has had N/V for around 3 weeks PTA. Pt was planning to have hiatal hernia repair soon. Pt only consuming 5-10% of meals at this time. Pt has been ordered ensure supplements, will continue.  Per chart review, pt has lost 14 lb since 3/9 (14% wt loss x 3 months, significant for time frame). Nutrition-Focused physical exam completed. Findings are severe fat depletion, severe muscle depletion, and no edema.   Medications: D5 infusion at 75 ml/hr -provides 306 kcal,  Labs reviewed: Low Na  Diet Order:  Diet full liquid Room service appropriate? Yes; Fluid consistency: Thin  Skin:  Reviewed, no issues  Last BM:  6/9  Height:   Ht Readings from Last 1 Encounters:  07/24/2016 4\' 11"  (1.499 m)    Weight:   Wt Readings from Last 1 Encounters:  07/14/16 89 lb 15.2 oz (40.8 kg)    Ideal Body Weight:  44.7  kg  BMI:  Body mass index is 18.17 kg/m.  Estimated Nutritional Needs:   Kcal:  1100-1300  Protein:  55-65g  Fluid:  1.3L/day  EDUCATION NEEDS:   No education needs identified at this time  Clayton Bibles, MS, RD, LDN Pager: [phone] After Hours Pager: [phone]

## 2016-07-15 NOTE — Progress Notes (Signed)
   07/15/16 0738  What Happened  Was fall witnessed? No  Was patient injured? No  Patient found on floor  Found by Staff-comment  Stated prior activity ambulating-unassisted  Follow Up  MD notified [place]  Time MD notified [phone]  Family notified No- patient refusal (attempted but was unable to reach the son)  Additional tests No  Simple treatment Other (comment) (helped back to bed)  Progress note created (see row info) Yes  Adult Fall Risk Assessment  Risk Factor Category (scoring not indicated) Fall has occurred during this admission (document High fall risk)  Patient's Fall Risk High Fall Risk (>13 points)  Adult Fall Risk Interventions  Required Bundle Interventions *See Row Information* High fall risk - low, moderate, and high requirements implemented  Additional Interventions Use of appropriate toileting equipment (bedpan, BSC, etc.)  Screening for Fall Injury Risk  Risk For Fall Injury- See Row Information  None identified  Neurological  Neuro (WDL) X  Level of Consciousness Responds to Voice  Orientation Level Oriented to person  Speech Clear  Musculoskeletal  Musculoskeletal (WDL) X  Generalized Weakness Yes  Integumentary  Integumentary (WDL) X  Skin Integrity Ecchymosis  Ecchymosis Location Hip  Ecchymosis Location Orientation Left

## 2016-07-15 NOTE — Progress Notes (Signed)
Pharmacy Antibiotic Note  Kelly Drake is a 83 y.o. female admitted on 07/07/2016 with aspiration pneumonia. Medical history significant for emphysema, B12 deficiency, hiatal hernia, and GERD,  presented to the ED at the direction of her PCP for evaluation of dysphagia to solids with nausea, vomiting, and progressive weight loss.  Pharmacy has been consulted for unasyn dosing.  Plan: unasyn 1.5gm IV q6h Follow renal function  Height: 4\' 11"  (149.9 cm) Weight: 89 lb 15.2 oz (40.8 kg) IBW/kg (Calculated) : 43.2  Temp (24hrs), Avg:98.1 F (36.7 C), Min:97.6 F (36.4 C), Max:98.5 F (36.9 C)   Recent Labs Lab 07/16/2016 2020 07/14/16 0523 07/15/16 0519  WBC 8.1 9.5 12.5*  CREATININE 1.40* 0.97 0.85    Estimated Creatinine Clearance: 31.7 mL/min (by C-G formula based on SCr of 0.85 mg/dL).    Allergies  Allergen Reactions  . Sulfonamide Derivatives     Antimicrobials this admission: 6/10 unasyn >>   Thank you for allowing pharmacy to be a part of this patient's care.  Dolly Rias RPh 07/15/2016, 2:11 PM Pager [phone]

## 2016-07-15 NOTE — Progress Notes (Signed)
   Subjective/Chief Complaint: Confused today. No further vomiting   Objective: Vital signs in last 24 hours: Temp:  [97.6 F (36.4 C)-98.5 F (36.9 C)] 97.6 F (36.4 C) (06/10 0712) Pulse Rate:  [73-84] 84 (06/10 0712) Resp:  [18-20] 18 (06/10 0712) BP: (113-174)/(46-94) 113/46 (06/10 0712) SpO2:  [97 %-100 %] 99 % (06/10 0712) Last BM Date: 07/14/16  Intake/Output from previous day: 06/09 0701 - 06/10 0700 In: 1908.3 [P.O.:120; I.V.:1338.3; IV Piggyback:450] Out: 450 [Urine:450] Intake/Output this shift: No intake/output data recorded.  General appearance: alert, cooperative and delirious Resp: clear to auscultation bilaterally Cardio: regular rate and rhythm GI: soft, nontender  Lab Results:   Recent Labs  07/14/16 0523 07/15/16 0519  WBC 9.5 12.5*  HGB 10.2* 9.3*  HCT 30.4* 28.3*  PLT 255 272   BMET  Recent Labs  07/14/16 0523 07/15/16 0519  NA 144 147*  K 2.8* 4.0  CL 109 115*  CO2 24 22  GLUCOSE 90 109*  BUN 30* 29*  CREATININE 0.97 0.85  CALCIUM 8.0* 8.2*   PT/INR No results for input(s): LABPROT, INR in the last 72 hours. ABG No results for input(s): PHART, HCO3 in the last 72 hours.  Invalid input(s): PCO2, PO2  Studies/Results: Dg Abd 1 View  Result Date: 07/14/2016 CLINICAL DATA:  Vomiting today. EXAM: ABDOMEN - 1 VIEW COMPARISON:  CT of 01/26/2010. FINDINGS: Cardiomegaly. Trace right pleural fluid or thickening. No gaseous distention of bowel loops. Nonspecific paucity of large and small bowel gas. Distal stool identified. No abnormal abdominal calcifications. No appendicolith. Convex right lumbar spine curvature. IMPRESSION: No acute findings.  Nonspecific paucity of abdominopelvic bowel gas. Electronically Signed   By: Abigail Miyamoto M.D.   On: 07/14/2016 17:11    Anti-infectives: Anti-infectives    None      Assessment/Plan: s/p * No surgery found * If no vomiting then would allow clears  Hiatal hernia. Will discuss with Dr.  Hassell Done in am Confusion to be evaluated by Medicine  LOS: 2 days    TOTH III,Haskel Dewalt S 07/15/2016

## 2016-07-15 NOTE — Progress Notes (Signed)
Patient not given oral medications due to being given IV anti-anxiety medication earlier due to increased confusion and agitation.

## 2016-07-16 LAB — BASIC METABOLIC PANEL
ANION GAP: 8 (ref 5–15)
BUN: 11 mg/dL (ref 6–20)
CO2: 27 mmol/L (ref 22–32)
Calcium: 8.2 mg/dL — ABNORMAL LOW (ref 8.9–10.3)
Chloride: 106 mmol/L (ref 101–111)
Creatinine, Ser: 0.62 mg/dL (ref 0.44–1.00)
Glucose, Bld: 109 mg/dL — ABNORMAL HIGH (ref 65–99)
POTASSIUM: 3.2 mmol/L — AB (ref 3.5–5.1)
SODIUM: 141 mmol/L (ref 135–145)

## 2016-07-16 LAB — CBC
HCT: 26.9 % — ABNORMAL LOW (ref 36.0–46.0)
Hemoglobin: 8.9 g/dL — ABNORMAL LOW (ref 12.0–15.0)
MCH: 28.3 pg (ref 26.0–34.0)
MCHC: 33.1 g/dL (ref 30.0–36.0)
MCV: 85.7 fL (ref 78.0–100.0)
PLATELETS: 255 10*3/uL (ref 150–400)
RBC: 3.14 MIL/uL — AB (ref 3.87–5.11)
RDW: 14.1 % (ref 11.5–15.5)
WBC: 7.6 10*3/uL (ref 4.0–10.5)

## 2016-07-16 LAB — PREALBUMIN: PREALBUMIN: 7.1 mg/dL — AB (ref 18–38)

## 2016-07-16 MED ORDER — VITAMIN B-12 100 MCG PO TABS
50.0000 ug | ORAL_TABLET | Freq: Every day | ORAL | Status: DC
  Administered 2016-07-17 – 2016-07-24 (×6): 50 ug via ORAL
  Filled 2016-07-16 (×10): qty 1

## 2016-07-16 MED ORDER — LORAZEPAM 2 MG/ML IJ SOLN: 0.5000 mg | Freq: Two times a day (BID) | INTRAMUSCULAR | Status: DC | PRN

## 2016-07-16 MED ORDER — FAMOTIDINE 20 MG PO TABS
20.0000 mg | ORAL_TABLET | Freq: Every day | ORAL | Status: DC
  Administered 2016-07-17 – 2016-07-20 (×4): 20 mg via ORAL
  Filled 2016-07-16 (×4): qty 1

## 2016-07-16 MED ORDER — POTASSIUM CHLORIDE 10 MEQ/100ML IV SOLN
10.0000 meq | INTRAVENOUS | Status: AC
  Administered 2016-07-16 (×3): 10 meq via INTRAVENOUS
  Filled 2016-07-16 (×4): qty 100

## 2016-07-16 NOTE — Progress Notes (Signed)
PROGRESS NOTE    Kelly Drake  KWI:[id number] DOB: 03-30-1930 DOA: 07/25/2016 PCP: Rusty Aus, MD    Brief Narrative:  Kelly Drake is a 83 y.o. female with medical history significant for emphysema, B12 deficiency, hiatal hernia, and GERD, now presenting to the emergency department at the direction of her primary care physician for evaluation of dysphagia to solids with nausea, vomiting, and progressive weight loss.   Assessment & Plan:   Principal Problem:   Acute kidney injury ([place]) Active Problems:   HYPERTENSION, BENIGN   COPD with emphysema (HCC)   Hypokalemia   Dysphagia   Normocytic anemia   Hiatal hernia   Prolonged QT interval  1-Dysphagia; Persistent Nausea , vomiting. Related to large hiatal hernia;  IV fluids.  Diet as tolerated.  IV Pepcid.  Sx consulted. Patient supose to have sx on 19.  QT resolved. Zofran prn KUB; negative Started on clear. Ensure ordered.  Pre albumin ordered. Will follow sx recommendation regarding next step to improved nutrition status.    2-Acute encephalopathy; Got confused overnight, fell during this admission.  Received ativan which make her sleepy  CT head negative for acute abnormalities, ammonia normal , TSH normal , B-12 elevated. .  Per family patient with confusion on and off for a month.  Will decrease dose of ativa, avoid oversedation.   Hypernatremia; check fluids to D 5.  Improved.   PNA;  Leukocytosis. Chest x ray; Right pleural effusion and basilar airspace disease which could be due to atelectasis, pneumonia or aspiration. Continue Unasyn.   Leukocytosis; related to PNA.  Check chest x ray and UA.   Hypokalemia; IV KCL.   Prolong QT; correct electrolytes.  Corrected.   AKI; due to hypovolemia, poor oral intake.  Improved with IV fluids.   COPD;  Continue scheduled ICS/LABA     6-Normocytic anemia;  Continue B12 supplementation  Hb stable.   DVT prophylaxis: Heparin.  Code Status: full  code.  Family Communication: care discussed with patient.  Disposition Plan: remain inpatient for IV fluids.    Consultants:   Surgery    Procedures: none   Antimicrobials: none   Subjective: She is more alert, she was oriented to person, place.  Still confuse.   Objective: Vitals:   07/15/16 0712 07/15/16 1302 07/15/16 2024 07/16/16 0700  BP: (!) 113/46 (!) 123/56 (!) 143/72 135/88  Pulse: 84 63 89 71  Resp: 18 16 (!) 22 18  Temp: 97.6 F (36.4 C) 98.2 F (36.8 C) 98 F (36.7 C) 97.7 F (36.5 C)  TempSrc: Axillary Axillary Axillary Oral  SpO2: 99% 100% 100% 97%  Weight:    41.5 kg (91 lb 7.9 oz)  Height:        Intake/Output Summary (Last 24 hours) at 07/16/16 1323 Last data filed at 07/16/16 0900  Gross per 24 hour  Intake          1543.33 ml  Output              400 ml  Net          1143.33 ml   Filed Weights   08/04/2016 2249 07/14/16 0442 07/16/16 0700  Weight: 41.8 kg (92 lb 2.4 oz) 40.8 kg (89 lb 15.2 oz) 41.5 kg (91 lb 7.9 oz)    Examination:  General exam: cachetic. Alert , reaching for things.  Respiratory system: normal respiratory effort, mild ronchus.  Cardiovascular system: S 1, S 2 RRR Gastrointestinal system: BS present, soft, nt  Central nervous system; alert, confuse. Follows come command,  Extremities: moves extremities.  Skin: No rash    Data Reviewed: I have personally reviewed following labs and imaging studies  CBC:  Recent Labs Lab 07/23/2016 2020 07/14/16 0523 07/15/16 0519 07/16/16 0753  WBC 8.1 9.5 12.5* 7.6  HGB 10.7* 10.2* 9.3* 8.9*  HCT 32.5* 30.4* 28.3* 26.9*  MCV 85.5 85.6 87.6 85.7  PLT 252 255 272 016   Basic Metabolic Panel:  Recent Labs Lab 07/31/2016 2020 07/14/16 0523 07/15/16 0519 07/16/16 0753  NA 142 144 147* 141  K 2.7* 2.8* 4.0 3.2*  CL 103 109 115* 106  CO2 27 24 22 27   GLUCOSE 102* 90 109* 109*  BUN 52* 30* 29* 11  CREATININE 1.40* 0.97 0.85 0.62  CALCIUM 9.1 8.0* 8.2* 8.2*  MG 2.2  --    --   --    GFR: Estimated Creatinine Clearance: 34.3 mL/min (by C-G formula based on SCr of 0.62 mg/dL). Liver Function Tests:  Recent Labs Lab 07/08/2016 2020  AST 22  ALT 16  ALKPHOS 80  BILITOT 1.3*  PROT 7.0  ALBUMIN 3.8    Recent Labs Lab 07/28/2016 2020  LIPASE 35    Recent Labs Lab 07/15/16 1103  AMMONIA <9*   Coagulation Profile: No results for input(s): INR, PROTIME in the last 168 hours. Cardiac Enzymes: No results for input(s): CKTOTAL, CKMB, CKMBINDEX, TROPONINI in the last 168 hours. BNP (last 3 results) No results for input(s): PROBNP in the last 8760 hours. HbA1C: No results for input(s): HGBA1C in the last 72 hours. CBG: No results for input(s): GLUCAP in the last 168 hours. Lipid Profile: No results for input(s): CHOL, HDL, LDLCALC, TRIG, CHOLHDL, LDLDIRECT in the last 72 hours. Thyroid Function Tests:  Recent Labs  07/15/16 1103  TSH 2.026   Anemia Panel:  Recent Labs  07/15/16 1103  VITAMINB12 1,289*   Sepsis Labs: No results for input(s): PROCALCITON, LATICACIDVEN in the last 168 hours.  No results found for this or any previous visit (from the past 240 hour(s)).       Radiology Studies: Dg Chest 2 View  Result Date: 07/15/2016 CLINICAL DATA:  Altered mental status. Status post fall this morning. History of COPD. EXAM: CHEST  2 VIEW COMPARISON:  PA and lateral chest 10/15/2013. FINDINGS: The patient is rotated on the examination. There is a right pleural effusion and basilar airspace disease. The lungs are emphysematous. Heart size is normal atherosclerosis noted. Hiatal hernia is noted. IMPRESSION: Right pleural effusion and basilar airspace disease which could be due to atelectasis, pneumonia or aspiration. COPD. Atherosclerosis. Hiatal hernia. Electronically Signed   By: Inge Rise M.D.   On: 07/15/2016 13:13   Dg Abd 1 View  Result Date: 07/14/2016 CLINICAL DATA:  Vomiting today. EXAM: ABDOMEN - 1 VIEW COMPARISON:  CT of  01/26/2010. FINDINGS: Cardiomegaly. Trace right pleural fluid or thickening. No gaseous distention of bowel loops. Nonspecific paucity of large and small bowel gas. Distal stool identified. No abnormal abdominal calcifications. No appendicolith. Convex right lumbar spine curvature. IMPRESSION: No acute findings.  Nonspecific paucity of abdominopelvic bowel gas. Electronically Signed   By: Abigail Miyamoto M.D.   On: 07/14/2016 17:11   Ct Head Wo Contrast  Result Date: 07/15/2016 CLINICAL DATA:  83 year old female status post fall from bed this morning. Confusion. EXAM: CT HEAD WITHOUT CONTRAST TECHNIQUE: Contiguous axial images were obtained from the base of the skull through the vertex without intravenous contrast. COMPARISON:  Report of [facility] head CT 05/19/1996 (no images available). FINDINGS: Brain: 5-6 mm coarse calcification along the right anterior operculum seems to be cortically based. In abnormality was described in a similar location in 1998. Cerebral volume is within normal limits for age. No midline shift, ventriculomegaly, mass effect, evidence of mass lesion, intracranial hemorrhage or evidence of cortically based acute infarction. Patchy bilateral white matter hypodensity, maximal in the posterior hemispheres. Vascular: Calcified atherosclerosis at the skull base. No suspicious intracranial vascular hyperdensity. Skull: Intact.  No acute osseous abnormality identified. Sinuses/Orbits: Clear. Other: No scalp hematoma or acute orbit soft tissue finding identified. IMPRESSION: 1. No acute traumatic injury or acute intracranial abnormality identified. 2. Dystrophic appearing calcification at the right anterior operculum is nonspecific, but is chronic judging from the 1998 head CT report, and significance is doubtful. 3. Mild to moderate for age cerebral white matter changes most commonly due to chronic small vessel disease. Electronically Signed   By: Genevie Ann M.D.   On:  07/15/2016 11:50        Scheduled Meds: . calcium-vitamin D  2 tablet Oral Daily  . DULoxetine  20 mg Oral Daily  . [START ON 07/17/2016] famotidine  20 mg Oral Daily  . feeding supplement (ENSURE ENLIVE)  237 mL Oral BID BM  . heparin  5,000 Units Subcutaneous Q8H  . mometasone-formoterol  2 puff Inhalation BID  . sodium chloride flush  3 mL Intravenous Q12H  . vitamin B-12  1,000 mcg Oral Daily   Continuous Infusions: . ampicillin-sulbactam (UNASYN) IV Stopped (07/16/16 1313)  . dextrose 75 mL/hr at 07/16/16 0108  . potassium chloride 10 mEq (07/16/16 1313)     LOS: 3 days    Time spent: 35 minutes.     Elmarie Shiley, MD [facility] Pager [phone]  If 7PM-7AM, please contact night-coverage [email or web address] Password [email or web address] 07/16/2016, 1:23 PM

## 2016-07-16 NOTE — Progress Notes (Signed)
CC:  vomiting  Subjective: Pt with Hiatal hernia admitted on 07/06/2016 for vomiting.  She is scheduled for [facility] repair by Dr. Hassell Done in 8 days. She has had nausea and vomiting x 3 weeks. Pt in bed and currently thinks it's 1937 and cannot recognize her son.  Says she is here when ask location.  She appears dehydrated and severely malnourished.  She has no pain or complaints of pain on exam.   He son says she has had N/V for some time but was OK last week and works part time even now at the hospital and was there last week.     Objective: Vital signs in last 24 hours: Temp:  [97.7 F (36.5 C)-98.2 F (36.8 C)] 97.7 F (36.5 C) (06/11 0700) Pulse Rate:  [63-89] 71 (06/11 0700) Resp:  [16-22] 18 (06/11 0700) BP: (123-143)/(56-88) 135/88 (06/11 0700) SpO2:  [97 %-100 %] 97 % (06/11 0700) Weight:  [41.5 kg (91 lb 7.9 oz)] 41.5 kg (91 lb 7.9 oz) (06/11 0700) Last BM Date: 07/14/16 NPO IV 1543 Urine 400 recorded last shift Afebrile, VSS K+ 3.2 Na is better, down to 141 from 147 Anemia HH 8.9/26.9 Esophogram 10/13/15: Cervical esophageal function appeared normal. The thoracic esophagus appeared normal to the level of the GE junction. Large hiatal hernia-partially intrathoracic stomach. Mild narrowing of the GE junction which did allow passage of the 13 mm barium tablet. No evidence of ulceration or mass. No gastroesophageal reflux was observed but certainly the patient is at increased risk for such reflux given the large hiatal hernia-partially intra thoracic stomach. CT head 07/15/16:  1. No acute traumatic injury or acute intracranial abnormality Identified.  Dystrophic appearing calcification at the right anterior operculum is nonspecific, but is chronic judging from the 1998 head CT report, and significance is doubtful.  Mild to moderate for age cerebral white matter changes most commonly due to chronic small vessel disease. CXR 07/15/16:  Right pleural effusion and basilar airspace  disease which could be due to atelectasis, pneumonia or aspiration. COPD., Atherosclerosis.  Hiatal hernia.  UA is negative  Intake/Output from previous day: 06/10 0701 - 06/11 0700 In: 1543.3 [I.V.:1393.3; IV Piggyback:150] Out: 400 [Urine:400] Intake/Output this shift: No intake/output data recorded.  General appearance: Pt is awake but looking up at the ceiling.  she thinks the year is 1935, completley disoriented and confused..  she is malnourisihe and cachectic Resp: clear to ascultation GI: soft, non-tender; bowel sounds normal; no masses,  no organomegaly and marked wasting Extremities: extremities normal, atraumatic, no cyanosis or edema and good distal pulsed  Lab Results:   Recent Labs  07/15/16 0519 07/16/16 0753  WBC 12.5* 7.6  HGB 9.3* 8.9*  HCT 28.3* 26.9*  PLT 272 255    BMET  Recent Labs  07/15/16 0519 07/16/16 0753  NA 147* 141  K 4.0 3.2*  CL 115* 106  CO2 22 27  GLUCOSE 109* 109*  BUN 29* 11  CREATININE 0.85 0.62  CALCIUM 8.2* 8.2*   PT/INR No results for input(s): LABPROT, INR in the last 72 hours.   Recent Labs Lab 07/14/2016 2020  AST 22  ALT 16  ALKPHOS 80  BILITOT 1.3*  PROT 7.0  ALBUMIN 3.8     Lipase     Component Value Date/Time   LIPASE 35 08/01/2016 2020     Medications: . calcium-vitamin D  2 tablet Oral Daily  . DULoxetine  20 mg Oral Daily  . feeding supplement (ENSURE ENLIVE)  237 mL Oral BID BM  . heparin  5,000 Units Subcutaneous Q8H  . mometasone-formoterol  2 puff Inhalation BID  . sodium chloride flush  3 mL Intravenous Q12H  . vitamin B-12  1,000 mcg Oral Daily   . ampicillin-sulbactam (UNASYN) IV 1.5 g (07/16/16 1004)  . dextrose 75 mL/hr at 07/16/16 0108  . famotidine (PEPCID) IV 20 mg (07/16/16 1004)   Anti-infectives    Start     Dose/Rate Route Frequency Ordered Stop   07/15/16 1500  ampicillin-sulbactam (UNASYN) 1.5 g in sodium chloride 0.9 % 50 mL IVPB     1.5 g 100 mL/hr over 30 Minutes  Intravenous Every 6 hours 07/15/16 1412        Assessment/Plan Hiatal hernia with nausea and vomiting - 3-4 weeks Acute encephalopaty - work up in progress Hypokalemia Hypertension COPD/atelectasis/?pneumonia Acute kidney injury Anemia Dysphagia Prolonged QT Anemia Malnutrition  BMI 18 FEN:   IV fluids/NPO ID:  Unasyn =>> day 2 DVT:  Heparin  Plan:  Check prealbumin. Watch progress on work up.  She looks severely malnourished and currently to sick for a surgery.  Will review with DR. Hassell Done.  She was seen in [place] and sent her by MD in [place].          LOS: 3 days    Kelly Drake 07/16/2016 [phone]

## 2016-07-16 NOTE — Progress Notes (Signed)
PHARMACIST - PHYSICIAN COMMUNICATION  DR:   Tyrell Antonio  CONCERNING: IV to Oral Route Change Policy  RECOMMENDATION: This patient is receiving pepcid by the intravenous route.  Based on criteria approved by the Pharmacy and Therapeutics Committee, the intravenous medication(s) is/are being converted to the equivalent oral dose form(s).   DESCRIPTION: These criteria include:  The patient is eating (either orally or via tube) and/or has been taking other orally administered medications for a least 24 hours  The patient has no evidence of active gastrointestinal bleeding or impaired GI absorption (gastrectomy, short bowel, patient on TNA or NPO).  If you have questions about this conversion, please contact the Pharmacy Department  []   [phone] )  [facility] []   [phone] )  [facility] []   [phone] )  [facility] []   [phone] )  [facility] [x]   [phone] )  [place], [place].D. [phone] 07/16/2016 1:10 PM

## 2016-07-17 ENCOUNTER — Inpatient Hospital Stay (HOSPITAL_COMMUNITY): Payer: PPO

## 2016-07-17 DIAGNOSIS — E43 Unspecified severe protein-calorie malnutrition: Secondary | ICD-10-CM

## 2016-07-17 DIAGNOSIS — E876 Hypokalemia: Secondary | ICD-10-CM

## 2016-07-17 LAB — BASIC METABOLIC PANEL
Anion gap: 7 (ref 5–15)
BUN: 8 mg/dL (ref 6–20)
CO2: 27 mmol/L (ref 22–32)
Calcium: 8.1 mg/dL — ABNORMAL LOW (ref 8.9–10.3)
Chloride: 103 mmol/L (ref 101–111)
Creatinine, Ser: 0.74 mg/dL (ref 0.44–1.00)
GFR calc Af Amer: >60 mL/min (ref >60)
GFR calc non Af Amer: >60 mL/min (ref >60)
Glucose, Bld: 105 mg/dL — ABNORMAL HIGH (ref 65–99)
Potassium: 2.9 mmol/L — ABNORMAL LOW (ref 3.5–5.1)
Sodium: 137 mmol/L (ref 135–145)

## 2016-07-17 LAB — CBC
HCT: 27 % — ABNORMAL LOW (ref 36.0–46.0)
HEMOGLOBIN: 9.2 g/dL — AB (ref 12.0–15.0)
MCH: 28.8 pg (ref 26.0–34.0)
MCHC: 34.1 g/dL (ref 30.0–36.0)
MCV: 84.6 fL (ref 78.0–100.0)
PLATELETS: 282 10*3/uL (ref 150–400)
RBC: 3.19 MIL/uL — AB (ref 3.87–5.11)
RDW: 13.8 % (ref 11.5–15.5)
WBC: 8.7 10*3/uL (ref 4.0–10.5)

## 2016-07-17 LAB — URINE CULTURE: Culture: NO GROWTH

## 2016-07-17 LAB — GLUCOSE, CAPILLARY: Glucose-Capillary: 108 mg/dL — ABNORMAL HIGH (ref 65–99)

## 2016-07-17 MED ORDER — FAT EMULSION 20 % IV EMUL
120.0000 mL | INTRAVENOUS | Status: AC
  Administered 2016-07-17: 120 mL via INTRAVENOUS
  Filled 2016-07-17: qty 250

## 2016-07-17 MED ORDER — SODIUM CHLORIDE 0.9% FLUSH
10.0000 mL | INTRAVENOUS | Status: DC | PRN
  Administered 2016-07-19: 10 mL
  Filled 2016-07-17: qty 40

## 2016-07-17 MED ORDER — SODIUM CHLORIDE 0.9% FLUSH
10.0000 mL | Freq: Two times a day (BID) | INTRAVENOUS | Status: DC
  Administered 2016-07-21: 10 mL
  Administered 2016-07-22: 20 mL
  Administered 2016-07-22: 17 mL
  Administered 2016-07-23 – 2016-07-26 (×7): 10 mL

## 2016-07-17 MED ORDER — POTASSIUM CHLORIDE 10 MEQ/100ML IV SOLN
10.0000 meq | INTRAVENOUS | Status: AC
  Administered 2016-07-17 (×4): 10 meq via INTRAVENOUS
  Filled 2016-07-17 (×4): qty 100

## 2016-07-17 MED ORDER — LORAZEPAM 2 MG/ML IJ SOLN
0.5000 mg | INTRAMUSCULAR | Status: AC
  Administered 2016-07-17: 0.5 mg via INTRAVENOUS
  Filled 2016-07-17: qty 1

## 2016-07-17 MED ORDER — CLINIMIX E/DEXTROSE (5/15) 5 % IV SOLN
INTRAVENOUS | Status: AC
  Administered 2016-07-17: 18:00:00 via INTRAVENOUS
  Filled 2016-07-17: qty 600

## 2016-07-17 MED ORDER — DEXTROSE 5 % IV SOLN
INTRAVENOUS | Status: DC
  Administered 2016-07-17 (×2): via INTRAVENOUS

## 2016-07-17 MED ORDER — INSULIN ASPART 100 UNIT/ML ~~LOC~~ SOLN
0.0000 [IU] | Freq: Four times a day (QID) | SUBCUTANEOUS | Status: DC
  Administered 2016-07-18: 2 [IU] via SUBCUTANEOUS
  Administered 2016-07-18 (×2): 1 [IU] via SUBCUTANEOUS
  Administered 2016-07-19: 2 [IU] via SUBCUTANEOUS
  Administered 2016-07-19 – 2016-07-23 (×5): 1 [IU] via SUBCUTANEOUS
  Administered 2016-07-23: 2 [IU] via SUBCUTANEOUS
  Administered 2016-07-23 – 2016-07-24 (×2): 1 [IU] via SUBCUTANEOUS

## 2016-07-17 MED ORDER — SODIUM CHLORIDE 0.9% FLUSH
10.0000 mL | INTRAVENOUS | Status: DC | PRN
Start: 2016-07-17 — End: 2016-07-25
  Administered 2016-07-19: 10 mL
  Filled 2016-07-17: qty 40

## 2016-07-17 NOTE — Care Management Important Message (Signed)
Important Message  Patient Details  Name: Kelly Drake MRN: [id number] Date of Birth: February 07, 1930   Medicare Important Message Given:  Yes    Kerin Salen 07/17/2016, 10:46 AMImportant Message  Patient Details  Name: Kelly Drake MRN: [id number] Date of Birth: September 23, 1930   Medicare Important Message Given:  Yes    Kerin Salen 07/17/2016, 10:45 AM

## 2016-07-17 NOTE — Progress Notes (Signed)
PROGRESS NOTE    Kelly Drake  XJD:[id number] DOB: 10-31-1930 DOA: 08/04/2016 PCP: Rusty Aus, MD    Brief Narrative:  Kelly Drake is a 83 y.o. female with medical history significant for emphysema, B12 deficiency, hiatal hernia, and GERD, now presenting to the emergency department at the direction of her primary care physician for evaluation of dysphagia to solids with nausea, vomiting, and progressive weight loss.   Assessment & Plan:   Principal Problem:   Acute kidney injury ([place]) Active Problems:   HYPERTENSION, BENIGN   COPD with emphysema (HCC)   Hypokalemia   Dysphagia   Normocytic anemia   Hiatal hernia   Prolonged QT interval  1-Dysphagia; Persistent Nausea , vomiting. Related to large hiatal hernia;  IV fluids.  Diet as tolerated.  IV Pepcid.  Sx consulted. Patient supose to have sx on 19.  QT resolved. Zofran prn KUB; negative Started on clear. Ensure ordered.  Pre albumin 7.  Discussed with Dr Hassell Done, plan for Picc line placement and start TPN to improved nutritional status.    2-Acute encephalopathy; improved.  Got confused overnight, fell during this admission.  Received ativan which make her sleepy, will discontinue ativan.  CT head negative for acute abnormalities, ammonia normal , TSH normal , B-12 elevated. .  Per family patient with confusion on and off for a month.  Will discontinue ativan.  Treating for PNA.   Severe Malnutrition;  Plan to start TPN.  Picc line ordered.   Hypernatremia; check fluids to D 5.  Improved.   PNA; ? Aspiration.  Leukocytosis. Chest x ray; Right pleural effusion and basilar airspace disease which could be due to atelectasis, pneumonia or aspiration. Continue Unasyn. WBC has decrease from 12---8  Leukocytosis; related to PNA.  Urine culture; no growth.   Hypokalemia; replete with IV kcl.  Prolong QT; correct electrolytes.  Corrected.   AKI; due to hypovolemia, poor oral intake.  Improved with IV  fluids.   COPD;  Continue scheduled ICS/LABA     6-Normocytic anemia;  Continue B12 supplementation  Hb stable.   DVT prophylaxis: Heparin.  Code Status: full code.  Family Communication: care discussed with patient.  Disposition Plan: remain inpatient for IV fluids.    Consultants:   Surgery    Procedures: none   Antimicrobials: none   Subjective: She is alert and oriented times 3.  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  She denies abdominal pain.  She only ate few spoon of breakfast, not eating enough.   Objective: Vitals:   07/16/16 2237 07/17/16 0357 07/17/16 0622 07/17/16 1354  BP: (!) 153/93  137/74 (!) 153/82  Pulse: 82  71 84  Resp: 16  16 15   Temp: 97.6 F (36.4 C)  97.8 F (36.6 C) 97.5 F (36.4 C)  TempSrc: Oral  Axillary Oral  SpO2: 99%  97% 98%  Weight:  42.4 kg (93 lb 7.6 oz)    Height:        Intake/Output Summary (Last 24 hours) at 07/17/16 1408 Last data filed at 07/16/16 1732  Gross per 24 hour  Intake             1315 ml  Output                0 ml  Net             1315 ml   Filed Weights   07/14/16 0442 07/16/16 0700  07/17/16 0357  Weight: 40.8 kg (89 lb 15.2 oz) 41.5 kg (91 lb 7.9 oz) 42.4 kg (93 lb 7.6 oz)    Examination:  General exam: Cachetic, alert, in no acute distress.  Respiratory system: Normal respiratory effort, no  Cardiovascular system: S 1, S 2 RRR Gastrointestinal system: BS present, soft, nt Central nervous system; alert, oriented. Moves all four extremities.  Extremities: moves extremities.  Skin: No rash    Data Reviewed: I have personally reviewed following labs and imaging studies  CBC:  Recent Labs Lab 07/30/2016 2020 07/14/16 0523 07/15/16 0519 07/16/16 0753 07/17/16 1005  WBC 8.1 9.5 12.5* 7.6 8.7  HGB 10.7* 10.2* 9.3* 8.9* 9.2*  HCT 32.5* 30.4* 28.3* 26.9* 27.0*  MCV 85.5 85.6 87.6 85.7 84.6  PLT 252 255 272 255 353   Basic Metabolic Panel:  Recent  Labs Lab 07/22/2016 2020 07/14/16 0523 07/15/16 0519 07/16/16 0753 07/17/16 1005  NA 142 144 147* 141 137  K 2.7* 2.8* 4.0 3.2* 2.9*  CL 103 109 115* 106 103  CO2 27 24 22 27 27   GLUCOSE 102* 90 109* 109* 105*  BUN 52* 30* 29* 11 8  CREATININE 1.40* 0.97 0.85 0.62 0.74  CALCIUM 9.1 8.0* 8.2* 8.2* 8.1*  MG 2.2  --   --   --   --    GFR: Estimated Creatinine Clearance: 35 mL/min (by C-G formula based on SCr of 0.74 mg/dL). Liver Function Tests:  Recent Labs Lab 07/16/2016 2020  AST 22  ALT 16  ALKPHOS 80  BILITOT 1.3*  PROT 7.0  ALBUMIN 3.8    Recent Labs Lab 08/02/2016 2020  LIPASE 35    Recent Labs Lab 07/15/16 1103  AMMONIA <9*   Coagulation Profile: No results for input(s): INR, PROTIME in the last 168 hours. Cardiac Enzymes: No results for input(s): CKTOTAL, CKMB, CKMBINDEX, TROPONINI in the last 168 hours. BNP (last 3 results) No results for input(s): PROBNP in the last 8760 hours. HbA1C: No results for input(s): HGBA1C in the last 72 hours. CBG: No results for input(s): GLUCAP in the last 168 hours. Lipid Profile: No results for input(s): CHOL, HDL, LDLCALC, TRIG, CHOLHDL, LDLDIRECT in the last 72 hours. Thyroid Function Tests:  Recent Labs  07/15/16 1103  TSH 2.026   Anemia Panel:  Recent Labs  07/15/16 1103  VITAMINB12 1,289*   Sepsis Labs: No results for input(s): PROCALCITON, LATICACIDVEN in the last 168 hours.  Recent Results (from the past 240 hour(s))  Urine Culture     Status: None   Collection Time: 07/15/16  4:20 PM  Result Value Ref Range Status   Specimen Description URINE, CLEAN CATCH  Final   Special Requests NONE  Final   Culture   Final    NO GROWTH Performed at [facility], [address], [place], [place] [address]    Report Status 07/17/2016 FINAL  Final         Radiology Studies: No results found.      Scheduled Meds: . calcium-vitamin D  2 tablet Oral Daily  . DULoxetine  20 mg Oral Daily  .  famotidine  20 mg Oral Daily  . feeding supplement (ENSURE ENLIVE)  237 mL Oral BID BM  . heparin  5,000 Units Subcutaneous Q8H  . insulin aspart  0-9 Units Subcutaneous Q6H  . mometasone-formoterol  2 puff Inhalation BID  . sodium chloride flush  10-40 mL Intracatheter Q12H  . sodium chloride flush  3 mL Intravenous Q12H  .  vitamin B-12  50 mcg Oral Daily   Continuous Infusions: . ampicillin-sulbactam (UNASYN) IV Stopped (07/17/16 0830)  . dextrose 75 mL/hr at 07/16/16 1527  . dextrose    . Marland KitchenTPN (CLINIMIX-E) Adult     And  . fat emulsion    . potassium chloride 10 mEq (07/17/16 1359)     LOS: 4 days    Time spent: 35 minutes.     Elmarie Shiley, MD [facility] Pager [phone]  If 7PM-7AM, please contact night-coverage [email or web address] Password [facility] 07/17/2016, 2:08 PM

## 2016-07-17 NOTE — Progress Notes (Signed)
OT Cancellation Note  Patient Details Name: Kelly Drake MRN: [id number] DOB: [birth year]   Cancelled Treatment:    Reason Eval/Treat Not Completed: Other (comment). Pt given Ativan and unable to participate at this time. Will follow up tomorrow as able.   [place], OT/L  [phone] 07/17/2016 07/17/2016, 5:17 PM

## 2016-07-17 NOTE — Progress Notes (Signed)
CC:  vomiting  Subjective: Much better today, alert, knows where she is, the year and President.  Didn't know how sick she was, says she didn't think anything of the vomiting.  Unfortunately she has a full liquid tray at her side and when ask how she is doing with liquids she says:  "is that for me?"  Objective: Vital signs in last 24 hours: Temp:  [97.6 F (36.4 C)-98.4 F (36.9 C)] 97.8 F (36.6 C) (06/12 0622) Pulse Rate:  [71-87] 71 (06/12 0622) Resp:  [16] 16 (06/12 0622) BP: (137-153)/(74-93) 137/74 (06/12 0622) SpO2:  [97 %-99 %] 97 % (06/12 0622) Weight:  [42.4 kg (93 lb 7.6 oz)] 42.4 kg (93 lb 7.6 oz) (06/12 0357) Last BM Date: 07/14/16 Nothing PO recorded - full liquids listed 1325 IV Voided x 9  Afebrile, VSS Prealbumin 7.1 yesterday No labs this AM CXR 6/10:  Right pleural effusion and basilar airspace disease which could be due to atelectasis, pneumonia or aspiration.  Intake/Output from previous day: 06/11 0701 - 06/12 0700 In: 1315 [I.V.:865; IV Piggyback:450] Out: -  Intake/Output this shift: No intake/output data recorded.  General appearance: alert, cooperative, no distress and lucid this AM and doing better from a mentation point of view. Resp: BS down in bases GI: soft, non-tender; bowel sounds normal; no masses,  no organomegaly  Lab Results:   Recent Labs  07/15/16 0519 07/16/16 0753  WBC 12.5* 7.6  HGB 9.3* 8.9*  HCT 28.3* 26.9*  PLT 272 255    BMET  Recent Labs  07/15/16 0519 07/16/16 0753  NA 147* 141  K 4.0 3.2*  CL 115* 106  CO2 22 27  GLUCOSE 109* 109*  BUN 29* 11  CREATININE 0.85 0.62  CALCIUM 8.2* 8.2*   PT/INR No results for input(s): LABPROT, INR in the last 72 hours.   Recent Labs Lab 07/24/2016 2020  AST 22  ALT 16  ALKPHOS 80  BILITOT 1.3*  PROT 7.0  ALBUMIN 3.8     Lipase     Component Value Date/Time   LIPASE 35 07/22/2016 2020     Medications: . calcium-vitamin D  2 tablet Oral Daily  .  DULoxetine  20 mg Oral Daily  . famotidine  20 mg Oral Daily  . feeding supplement (ENSURE ENLIVE)  237 mL Oral BID BM  . heparin  5,000 Units Subcutaneous Q8H  . mometasone-formoterol  2 puff Inhalation BID  . sodium chloride flush  3 mL Intravenous Q12H  . vitamin B-12  50 mcg Oral Daily   . ampicillin-sulbactam (UNASYN) IV 1.5 g (07/17/16 0805)  . dextrose 75 mL/hr at 07/16/16 1527   Anti-infectives    Start     Dose/Rate Route Frequency Ordered Stop   07/15/16 1500  ampicillin-sulbactam (UNASYN) 1.5 g in sodium chloride 0.9 % 50 mL IVPB     1.5 g 100 mL/hr over 30 Minutes Intravenous Every 6 hours 07/15/16 1412        Assessment/Plan Hiatal hernia with nausea and vomiting - 3-4 weeks Acute encephalopaty - work up in progress Hypokalemia Hypertension COPD/atelectasis/?pneumonia/atelectasis/right pleural effusion Acute kidney injury Anemia Dysphagia Prolonged QT Anemia Malnutrition  BMI 18 FEN:   IV fluids/full liquids ID:  Unasyn 07/15/16 =>> day 3 DVT:  Heparin  Plan:  She is much better, still needs some help with mentation, better but still not back to normal.  Will discuss with Dr. Hassell Done.  I think we need to get her over current  issues and work to get her nutrition back to a level where she can tolerate/ heal after surgery.      LOS: 4 days    Kelly Drake 07/17/2016 [phone]

## 2016-07-17 NOTE — Evaluation (Signed)
Physical Therapy Evaluation Patient Details Name: Kelly Drake MRN: [id number] DOB: 1930-07-13 Today's Date: 07/17/2016   History of Present Illness  Kelly Drake is a 83 y.o. female with medical history significant for emphysema, B12 deficiency, hiatal hernia, and GERD, now presenting to the emergency department at the direction of her primary care physician for evaluation of dysphagia to solids with nausea, vomiting, and progressive weight loss.  Found to have encephalopathy due to UTI.  Clinical Impression  Patient presents with decreased independence with mobility due to deficits listed in PT problem list.  She will benefit from skilled PT in the acute setting to allow return home following SNF level rehab stay.  Patient currently mod A level for bed to [facility].  Was independent and working as Psychologist, occupational in hospital.      Follow Up Recommendations SNF;Supervision/Assistance - 24 hour    Equipment Recommendations  Rolling walker with 5" wheels    Recommendations for Other Services       Precautions / Restrictions Precautions Precautions: Fall      Mobility  Bed Mobility Overal bed mobility: Needs Assistance Bed Mobility: Supine to Sit     Supine to sit: Mod assist     General bed mobility comments: assist for trunk and to scoot to EOB  Transfers Overall transfer level: Needs assistance Equipment used: Rolling walker (2 wheeled) Transfers: Sit to/from [employer] Transfers   Stand pivot transfers: Mod assist       General transfer comment: to [facility], assist for anterior weight shift and for safety reaching to [facility], pt posterior throughout  Ambulation/Gait Ambulation/Gait assistance: Mod assist Ambulation Distance (Feet): 2 Feet Assistive device: Rolling walker (2 wheeled) Gait Pattern/deviations: Step-to pattern;Decreased stride length;Shuffle;Festinating;Narrow base of support     General Gait Details: unable to take steps, shuffling and very small steps, c/o  back pain so reversed back to sit on bed  Stairs            Wheelchair Mobility    Modified Rankin (Stroke Patients Only)       Balance Overall balance assessment: Needs assistance   Sitting balance-Leahy Scale: Fair   Postural control: Posterior lean Standing balance support: Bilateral upper extremity supported Standing balance-Leahy Scale: Poor Standing balance comment: leaning posterior throughout with mod support for balance with RW                             Pertinent Vitals/Pain Pain Assessment: Faces Faces Pain Scale: Hurts little more Pain Location: aching in back Pain Descriptors / Indicators: Aching Pain Intervention(s): Monitored during session;Repositioned    Home Living Family/patient expects to be discharged to:: Private residence Living Arrangements: Alone Available Help at Discharge: Family;Available PRN/intermittently Type of Home: House       Home Layout: One level        Prior Function Level of Independence: Independent         Comments: was volunteer at [employer]        Extremity/Trunk Assessment   Upper Extremity Assessment Upper Extremity Assessment: Generalized weakness    Lower Extremity Assessment Lower Extremity Assessment: Generalized weakness    Cervical / Trunk Assessment Cervical / Trunk Assessment: Kyphotic  Communication   Communication: No difficulties  Cognition Arousal/Alertness: Awake/alert Behavior During Therapy: WFL for tasks assessed/performed Overall Cognitive Status: Impaired/Different from baseline Area of Impairment: Memory;Following commands;Problem solving  Following Commands: Follows one step commands consistently;Follows one step commands with increased time     Problem Solving: Slow processing;Decreased initiation;Requires verbal cues;Requires tactile cues General Comments: perseverating on not wanting to leave today even  after son and PT informed not leaving today      General Comments General comments (skin integrity, edema, etc.): incontinent on the way to [facility]    Exercises     Assessment/Plan    PT Assessment Patient needs continued PT services  PT Problem List Decreased strength;Decreased mobility;Decreased cognition;Decreased balance;Decreased activity tolerance;Decreased knowledge of use of DME;Pain;Decreased safety awareness       PT Treatment Interventions DME instruction;Gait training;Balance training;Therapeutic exercise;Patient/family education;Therapeutic activities;Functional mobility training;Stair training    PT Goals (Current goals can be found in the Care Plan section)  Acute Rehab PT Goals Patient Stated Goal: None stated PT Goal Formulation: Patient unable to participate in goal setting Time For Goal Achievement: 07/31/16 Potential to Achieve Goals: Fair    Frequency Min 3X/week   Barriers to discharge Decreased caregiver support      Co-evaluation               AM-PAC PT "6 Clicks" Daily Activity  Outcome Measure Difficulty turning over in bed (including adjusting bedclothes, sheets and blankets)?: A Little Difficulty moving from lying on back to sitting on the side of the bed? : Total Difficulty sitting down on and standing up from a chair with arms (e.g., wheelchair, bedside commode, etc,.)?: Total Help needed moving to and from a bed to chair (including a wheelchair)?: A Lot Help needed walking in hospital room?: A Lot Help needed climbing 3-5 steps with a railing? : Total 6 Click Score: 10    End of Session Equipment Utilized During Treatment: Gait belt Activity Tolerance: Patient limited by pain Patient left: in bed;with call bell/phone within reach Nurse Communication: Mobility status PT Visit Diagnosis: Muscle weakness (generalized) (M62.81);Other symptoms and signs involving the nervous system (R29.898);Other abnormalities of gait and mobility  (R26.89)    Time: 9924-2683 PT Time Calculation (min) (ACUTE ONLY): 30 min   Charges:   PT Evaluation $PT Eval Moderate Complexity: 1 Procedure PT Treatments $Therapeutic Activity: 8-22 mins   PT G CodesMagda Kiel, [place] [phone] 07/17/2016   Reginia Naas 07/17/2016, 4:55 PM

## 2016-07-17 NOTE — Progress Notes (Signed)
Peripherally Inserted Central Catheter/Midline Placement  The IV Nurse has discussed with the patient and/or persons authorized to consent for the patient, the purpose of this procedure and the potential benefits and risks involved with this procedure.  The benefits include less needle sticks, lab draws from the catheter, and the patient may be discharged home with the catheter. Risks include, but not limited to, infection, bleeding, blood clot (thrombus formation), and puncture of an artery; nerve damage and irregular heartbeat and possibility to perform a PICC exchange if needed/ordered by physician.  Alternatives to this procedure were also discussed.  Bard Power PICC patient education guide, fact sheet on infection prevention and patient information card has been provided to patient /or left at bedside.    PICC/Midline Placement Documentation  PICC Double Lumen 07/17/16 PICC Right Brachial 33 cm 0 cm (Active)  Indication for Insertion or Continuance of Line Administration of hyperosmolar/irritating solutions (i.e. TPN, Vancomycin, etc.) 07/17/2016  3:00 PM  Exposed Catheter (cm) 0 cm 07/17/2016  3:00 PM  Dressing Change Due 07/24/16 07/17/2016  3:00 PM    Telephone consent   [name] 07/17/2016, 3:22 PM

## 2016-07-17 NOTE — Progress Notes (Signed)
Pt has increase confusion, restless, combative, attempting to get OOB, MD called. Ativan.5mg  ordered. SRP,RN

## 2016-07-17 NOTE — Progress Notes (Signed)
Insulin Requirements: not on insulin at the current time  Current Nutrition: Ensure BID. Full liquid diet, per flow sheet no meals charted  IVF: D5W at 75 ml/hr  Central access: 07/17/16 TPN start date: 07/17/16  ASSESSMENT                                                                                                          HPI: Pt is a 83 yo female admitted due to dysphagia, persistent nausea, vomiting related to large hiatal hernia.   Significant events:  6/12 Start TPN   Today:    Glucose - 105   Electrolytes - K+ 2.9, CorrCa at 8.7, others WNL  Renal - SCr 0.74  LFTs - WNL 6/8  TGs -  Prealbumin - 7.1  NUTRITIONAL GOALS                                                                                             RD recs: 6/10  Kcal:  1100-1300 Protein:  55-65g Fluid:  1.3L/day  Clinimix E 5/15 at a goal rate of 50 ml/hr + 20% fat emulsion at 10 ml/hr to provide: 60 g/day protein, 1332 Kcal/day.  PLAN      KCl 10 meq IV x4 ordered already by MD                                                                                                                      At 1800 today:  Start Clinimix E 5/15 at 25 ml/hr.  20% fat emulsion at 5 ml/hr.  Plan to advance as tolerated to the goal rate.  TPN to contain standard multivitamins  Trace elements MWF  Reduce IVF to 50 ml/hr.  Add sensitive SSI q6h .   TPN lab panels on Mondays & Thursdays.  CMP, Ca, Mg with AM labs   F/u daily.   Royetta Asal, PharmD, BCPS Pager [phone]  07/17/2016 1:35 PM

## 2016-07-17 NOTE — Progress Notes (Deleted)
Peripherally Inserted Central Catheter/Midline Placement  The IV Nurse has discussed with the patient and/or persons authorized to consent for the patient, the purpose of this procedure and the potential benefits and risks involved with this procedure.  The benefits include less needle sticks, lab draws from the catheter, and the patient may be discharged home with the catheter. Risks include, but not limited to, infection, bleeding, blood clot (thrombus formation), and puncture of an artery; nerve damage and irregular heartbeat and possibility to perform a PICC exchange if needed/ordered by physician.  Alternatives to this procedure were also discussed.  Bard Power PICC patient education guide, fact sheet on infection prevention and patient information card has been provided to patient /or left at bedside.    PICC/Midline Placement Documentation  PICC Double Lumen 41/58/30 PICC Right Basilic 40 cm 0 cm (Active)  Indication for Insertion or Continuance of Line Prolonged intravenous therapies 07/17/2016 12:00 PM  Exposed Catheter (cm) 0 cm 07/17/2016 12:00 PM  Dressing Change Due 07/24/16 07/17/2016 12:00 PM       [name] 07/17/2016, 12:52 PM

## 2016-07-18 LAB — CBC
HEMATOCRIT: 25.1 % — AB (ref 36.0–46.0)
Hemoglobin: 8.6 g/dL — ABNORMAL LOW (ref 12.0–15.0)
MCH: 28.5 pg (ref 26.0–34.0)
MCHC: 34.3 g/dL (ref 30.0–36.0)
MCV: 83.1 fL (ref 78.0–100.0)
Platelets: 275 10*3/uL (ref 150–400)
RBC: 3.02 MIL/uL — ABNORMAL LOW (ref 3.87–5.11)
RDW: 13.9 % (ref 11.5–15.5)
WBC: 8.4 10*3/uL (ref 4.0–10.5)

## 2016-07-18 LAB — DIFFERENTIAL
BASOS ABS: 0 10*3/uL (ref 0.0–0.1)
BASOS PCT: 0 %
Eosinophils Absolute: 0.3 10*3/uL (ref 0.0–0.7)
Eosinophils Relative: 3 %
Lymphocytes Relative: 16 %
Lymphs Abs: 1.3 10*3/uL (ref 0.7–4.0)
MONOS PCT: 6 %
Monocytes Absolute: 0.5 10*3/uL (ref 0.1–1.0)
NEUTROS PCT: 75 %
Neutro Abs: 6.3 10*3/uL (ref 1.7–7.7)

## 2016-07-18 LAB — COMPREHENSIVE METABOLIC PANEL
ALBUMIN: 2.3 g/dL — AB (ref 3.5–5.0)
ALT: 15 U/L (ref 14–54)
AST: 20 U/L (ref 15–41)
Alkaline Phosphatase: 63 U/L (ref 38–126)
Anion gap: 7 (ref 5–15)
BUN: 9 mg/dL (ref 6–20)
CHLORIDE: 103 mmol/L (ref 101–111)
CO2: 28 mmol/L (ref 22–32)
Calcium: 8.6 mg/dL — ABNORMAL LOW (ref 8.9–10.3)
Creatinine, Ser: 0.62 mg/dL (ref 0.44–1.00)
GFR calc Af Amer: >60 mL/min (ref >60)
GFR calc non Af Amer: >60 mL/min (ref >60)
GLUCOSE: 133 mg/dL — AB (ref 65–99)
POTASSIUM: 3.1 mmol/L — AB (ref 3.5–5.1)
Sodium: 138 mmol/L (ref 135–145)
Total Bilirubin: 0.3 mg/dL (ref 0.3–1.2)
Total Protein: 4.8 g/dL — ABNORMAL LOW (ref 6.5–8.1)

## 2016-07-18 LAB — TRIGLYCERIDES: TRIGLYCERIDES: 100 mg/dL (ref <150)

## 2016-07-18 LAB — GLUCOSE, CAPILLARY
GLUCOSE-CAPILLARY: 150 mg/dL — AB (ref 65–99)
GLUCOSE-CAPILLARY: 176 mg/dL — AB (ref 65–99)
Glucose-Capillary: 137 mg/dL — ABNORMAL HIGH (ref 65–99)

## 2016-07-18 LAB — PHOSPHORUS: Phosphorus: 2.8 mg/dL (ref 2.5–4.6)

## 2016-07-18 LAB — MAGNESIUM: Magnesium: 1.4 mg/dL — ABNORMAL LOW (ref 1.7–2.4)

## 2016-07-18 LAB — PREALBUMIN: PREALBUMIN: 6.5 mg/dL — AB (ref 18–38)

## 2016-07-18 MED ORDER — POTASSIUM CHLORIDE 10 MEQ/100ML IV SOLN
10.0000 meq | INTRAVENOUS | Status: AC
  Administered 2016-07-18 (×4): 10 meq via INTRAVENOUS
  Filled 2016-07-18 (×4): qty 100

## 2016-07-18 MED ORDER — MAGNESIUM SULFATE 2 GM/50ML IV SOLN
2.0000 g | Freq: Once | INTRAVENOUS | Status: AC
  Administered 2016-07-18: 2 g via INTRAVENOUS
  Filled 2016-07-18: qty 50

## 2016-07-18 MED ORDER — TRACE MINERALS CR-CU-MN-SE-ZN 10-1000-500-60 MCG/ML IV SOLN
INTRAVENOUS | Status: DC
  Administered 2016-07-18: 17:00:00 via INTRAVENOUS
  Filled 2016-07-18: qty 600

## 2016-07-18 MED ORDER — POTASSIUM CL IN DEXTROSE 5% 20 MEQ/L IV SOLN
20.0000 meq | INTRAVENOUS | Status: DC
  Administered 2016-07-18 – 2016-07-19 (×2): 20 meq via INTRAVENOUS
  Filled 2016-07-18 (×2): qty 1000

## 2016-07-18 MED ORDER — FAT EMULSION 20 % IV EMUL
120.0000 mL | INTRAVENOUS | Status: DC
  Administered 2016-07-18: 120 mL via INTRAVENOUS
  Filled 2016-07-18: qty 200

## 2016-07-18 MED ORDER — PROCHLORPERAZINE EDISYLATE 5 MG/ML IJ SOLN
5.0000 mg | Freq: Once | INTRAMUSCULAR | Status: AC
  Administered 2016-07-19: 5 mg via INTRAVENOUS
  Filled 2016-07-18: qty 2

## 2016-07-18 NOTE — NC FL2 (Signed)
[place] LEVEL OF CARE SCREENING TOOL     IDENTIFICATION  Patient Name: Kelly Drake Birthdate: 25-Dec-1930 Sex: female Admission Date (Current Location): 07/09/2016  [facility] and [place] Number:  Herbalist and Address:  [facility],  [address] [place], [place]      Provider Number: [id number]  Attending Physician Name and Address:  Annita Brod, MD  Relative Name and Phone Number:       Current Level of Care: Hospital Recommended Level of Care: [place] Prior Approval Number:    Date Approved/Denied:   PASRR Number:   [id number]   Discharge Plan: SNF    Current Diagnoses: Patient Active Problem List   Diagnosis Date Noted  . Acute kidney injury ([place]) 07/07/2016  . Hypokalemia 07/12/2016  . Dysphagia 07/26/2016  . Normocytic anemia 07/12/2016  . Hiatal hernia 07/17/2016  . Prolonged QT interval 07/25/2016  . Dehydration   . Combined fat and carbohydrate induced hyperlipemia 09/08/2014  . Swelling of both lips 07/08/2014  . Closed wedge compression fracture of thoracic vertebra ([place]) 04/21/2014  . Adult idiopathic generalized osteoporosis 12/02/2013  . Asthma, persistent controlled 12/02/2013  . B12 deficiency 07/26/2013  . History of neoplasm of bladder 03/26/2013  . Exertional chest pain 01/01/2013  . Infection of urinary tract 04/11/2010  . CANDIDIASIS, ORAL 11/24/2009  . Mixed incontinence 05/20/2009  . Prolapse of vaginal vault after hysterectomy 05/20/2009  . Malignant neoplasm of lateral wall of urinary bladder ([place]) 03/08/2009  . Microscopic hematuria 02/07/2009  . Bladder infection, chronic 01/20/2009  . HYPERTENSION, BENIGN 06/24/2008  . COPD with emphysema ([place]) 01/06/2008  . G E R D 01/06/2008    Orientation RESPIRATION BLADDER Height & Weight     Self  Normal Incontinent Weight: 91 lb 14.9 oz (41.7 kg) Height:  4\' 11"  (149.9 cm)  BEHAVIORAL SYMPTOMS/MOOD NEUROLOGICAL BOWEL  NUTRITION STATUS      Incontinent    AMBULATORY STATUS COMMUNICATION OF NEEDS Skin   Limited Assist Verbally Normal                       Personal Care Assistance Level of Assistance  Bathing, Feeding, Dressing Bathing Assistance: Limited assistance Feeding assistance: Independent Dressing Assistance: Limited assistance     Functional Limitations Info             SPECIAL CARE FACTORS FREQUENCY                       Contractures      Additional Factors Info  Code Status, Allergies Code Status Info: Full Code  Allergies Info: SULFONAMIDE DERIVATIVES            Current Medications (07/18/2016):  This is the current hospital active medication list Current Facility-Administered Medications  Medication Dose Route Frequency Provider Last Rate Last Dose  . acetaminophen (TYLENOL) tablet 650 mg  650 mg Oral Q6H PRN Opyd, Ilene Qua, MD       Or  . acetaminophen (TYLENOL) suppository 650 mg  650 mg Rectal Q6H PRN Opyd, Ilene Qua, MD      . ampicillin-sulbactam (UNASYN) 1.5 g in sodium chloride 0.9 % 50 mL IVPB  1.5 g Intravenous Q6H Regalado, Belkys A, MD   Stopped at 07/18/16 0930  . bisacodyl (DULCOLAX) EC tablet 5 mg  5 mg Oral Daily PRN Opyd, Ilene Qua, MD      . calcium-vitamin D (OSCAL WITH D)  500-200 MG-UNIT per tablet 2 tablet  2 tablet Oral Daily Opyd, Ilene Qua, MD   2 tablet at 07/18/16 0903  . dextrose 5 % with KCl 20 mEq / L  infusion  20 mEq Intravenous Continuous Minda Ditto, RPH 50 mL/hr at 07/18/16 0859 20 mEq at 07/18/16 0859  . DULoxetine (CYMBALTA) DR capsule 20 mg  20 mg Oral Daily Opyd, Ilene Qua, MD   20 mg at 07/18/16 0858  . famotidine (PEPCID) tablet 20 mg  20 mg Oral Daily Regalado, Belkys A, MD   20 mg at 07/18/16 0903  . TPN (CLINIMIX-E) Adult   Intravenous Continuous TPN Nyoka Cowden, Terri L, RPH       And  . fat emulsion 20 % infusion 120 mL  120 mL Intravenous Continuous TPN Minda Ditto, RPH      . feeding supplement (ENSURE ENLIVE)  (ENSURE ENLIVE) liquid 237 mL  237 mL Oral BID BM Opyd, Ilene Qua, MD   237 mL at 07/18/16 0905  . heparin injection 5,000 Units  5,000 Units Subcutaneous Q8H Vianne Bulls, MD   5,000 Units at 07/17/16 2058  . hydrALAZINE (APRESOLINE) injection 5 mg  5 mg Intravenous Q6H PRN Rise Patience, MD   5 mg at 07/14/16 0045  . HYDROcodone-acetaminophen (NORCO/VICODIN) 5-325 MG per tablet 1-2 tablet  1-2 tablet Oral Q4H PRN Opyd, Ilene Qua, MD   2 tablet at 07/18/16 0903  . insulin aspart (novoLOG) injection 0-9 Units  0-9 Units Subcutaneous Q6H Royetta Asal, RPH   1 Units at 07/18/16 1221  . mometasone-formoterol (DULERA) 100-5 MCG/ACT inhaler 2 puff  2 puff Inhalation BID Opyd, Ilene Qua, MD   2 puff at 07/18/16 0851  . ondansetron (ZOFRAN) injection 4 mg  4 mg Intravenous Q6H PRN Regalado, Belkys A, MD      . polyethylene glycol (MIRALAX / GLYCOLAX) packet 17 g  17 g Oral Daily PRN Opyd, Ilene Qua, MD      . sodium chloride flush (NS) 0.9 % injection 10-40 mL  10-40 mL Intracatheter Q12H Regalado, Belkys A, MD      . sodium chloride flush (NS) 0.9 % injection 10-40 mL  10-40 mL Intracatheter PRN Regalado, Belkys A, MD      . sodium chloride flush (NS) 0.9 % injection 10-40 mL  10-40 mL Intracatheter PRN Regalado, Belkys A, MD      . sodium chloride flush (NS) 0.9 % injection 3 mL  3 mL Intravenous Q12H Opyd, Ilene Qua, MD   3 mL at 07/17/16 1203  . TPN (CLINIMIX-E) Adult   Intravenous Continuous TPN Royetta Asal, RPH 25 mL/hr at 07/17/16 1753    . vitamin B-12 (CYANOCOBALAMIN) tablet 50 mcg  50 mcg Oral Daily Regalado, Belkys A, MD   50 mcg at 07/18/16 0998     Discharge Medications: Please see discharge summary for a list of discharge medications.  Relevant Imaging Results:  Relevant Lab Results:   Additional Information SS#: [id number]  Weston Anna, LCSW

## 2016-07-18 NOTE — Evaluation (Signed)
Clinical/Bedside Swallow Evaluation Patient Details  Name: Kelly Drake MRN: [id number] Date of Birth: 28-Apr-1930  Today's Date: 07/18/2016 Time: SLP Start Time (ACUTE ONLY): 25 SLP Stop Time (ACUTE ONLY): 1640 SLP Time Calculation (min) (ACUTE ONLY): 15 min  Past Medical History:  Past Medical History:  Diagnosis Date  . Asthma   . B12 deficiency   . Bladder cancer ([place])   . COPD (chronic obstructive pulmonary disease) ([place])   . Cystitis   . Fibrocystic breast disease   . GERD (gastroesophageal reflux disease)   . Hematuria   . History of hiatal hernia   . Hyperlipidemia   . Hypertension   . IBS (irritable bowel syndrome)   . Osteoporosis    Past Surgical History:  Past Surgical History:  Procedure Laterality Date  . BREAST CYST EXCISION  1964  . ESOPHAGOGASTRODUODENOSCOPY (EGD) WITH PROPOFOL N/A 11/04/2015   Procedure: ESOPHAGOGASTRODUODENOSCOPY (EGD) WITH PROPOFOL;  Surgeon: Manya Silvas, MD;  Location: [facility] ENDOSCOPY;  Service: Endoscopy;  Laterality: N/A;  . PARTIAL HYSTERECTOMY  1979  . SAVORY DILATION N/A 11/04/2015   Procedure: SAVORY DILATION;  Surgeon: Manya Silvas, MD;  Location[facility] ENDOSCOPY;  Service: Endoscopy;  Laterality: N/A;   HPI:  83 year old femald admitted 07/23/2016   Assessment / Plan / Recommendation Clinical Impression  Pt was seen at bedside during (full liquid) dinner, which pt was struggling with. Pt reported on multiple occasions that "it just doesn't want to go down". Recent Barium Swallow (06/12/16) reveals a large hiatal hernia, partially intrathoracic stomach, and a tortuous mid-esophagus. Normal cervical esophagus and proximal half of the thoracic esophagus. Pt report raises suspicion, and Barium Swallow result confirms primary esophageal dysphagia. No overt s/s aspiration were observed during this assessment, however, intake was minimal due to esophageal difficulty.   Pt is at risk for aspiration due to significance of esophageal  dysmotility, and COPD increases risk for silent aspiration. Recommend continuing with full liquid diet until effective management of esophageal issues. ST will follow for education.  SLP Visit Diagnosis: Dysphagia, pharyngoesophageal phase (R13.14)    Aspiration Risk  Moderate aspiration risk;Mild aspiration risk    Diet Recommendation  (full liquids)   Liquid Administration via: Cup;Straw Medication Administration: Crushed with puree Supervision: Patient able to self feed Compensations: Minimize environmental distractions;Slow rate;Small sips/bites;Follow solids with liquid (begin meals with warm liquid) Postural Changes: Seated upright at 90 degrees;Remain upright for at least 30 minutes after po intake    Other  Recommendations Oral Care Recommendations: Oral care BID   Follow up Recommendations  (TBD)      Frequency and Duration min 1 x/week  1 week;2 weeks       Prognosis Prognosis for Safe Diet Advancement: Good      Swallow Study   General Date of Onset: 07/18/2016 HPI: 83 year old femald admitted 08/03/2016 Type of Study: Bedside Swallow Evaluation Previous Swallow Assessment: none Diet Prior to this Study:  (full liquid) Temperature Spikes Noted: No Respiratory Status: Room air History of Recent Intubation: No Behavior/Cognition: Alert;Pleasant mood;Cooperative Oral Cavity Assessment: Within Functional Limits Oral Care Completed by SLP: No Oral Cavity - Dentition: Adequate natural dentition Vision: Functional for self-feeding Self-Feeding Abilities: Able to feed self Patient Positioning: Upright in bed Baseline Vocal Quality: Normal Volitional Cough: Weak Volitional Swallow: Able to elicit    Oral/Motor/Sensory Function Overall Oral Motor/Sensory Function: Within functional limits   Ice Chips Ice chips: Not tested   Thin Liquid Thin Liquid: Within functional limits Presentation:  Cup    Nectar Thick Nectar Thick Liquid: Within functional limits Presentation:  Self Fed;Straw   Honey Thick Honey Thick Liquid: Not tested   Puree Puree: Not tested   Solid   GO   Solid: Not tested       Celia B. Martin, [facility] [place], [place]  Shonna Chock 07/18/2016,4:42 PM

## 2016-07-18 NOTE — Evaluation (Signed)
Occupational Therapy Evaluation Patient Details Name: Kelly Drake MRN: [id number] DOB: [birth year] Today's Date: 07/18/2016    History of Present Illness Kelly Drake is a 83 y.o. female with medical history significant for emphysema, B12 deficiency, hiatal hernia, and GERD, now presenting to the emergency department at the direction of her primary care physician for evaluation of dysphagia to solids with nausea, vomiting, and progressive weight loss.  Found to have encephalopathy due to UTI.   Clinical Impression   Pt admitted with encephalopathy. Pt currently with functional limitations due to the deficits listed below (see OT Problem List).  Pt will benefit from skilled OT to increase their safety and independence with ADL and functional mobility for ADL to facilitate discharge to venue listed below.      Follow Up Recommendations  SNF    Equipment Recommendations  None recommended by OT       Precautions / Restrictions Precautions Precautions: Fall      Mobility Bed Mobility Overal bed mobility: Needs Assistance Bed Mobility: Supine to Sit     Supine to sit: Mod assist     General bed mobility comments: assist for trunk and to scoot to EOB  Transfers                 General transfer comment: did not perform as pt needed to return to supine    Balance Overall balance assessment: Modified Independent Sitting-balance support: Single extremity supported Sitting balance-Leahy Scale: Fair                                     ADL either performed or assessed with clinical judgement   ADL          Pt sat EOB and needed to return to supine. Pt overall max- total A with ADL activity at this time.Will further evaluate ADL activity as pt feels better.                                      Vision                Pertinent Vitals/Pain Pain Assessment: Faces Faces Pain Scale: Hurts little more Pain Location: bach Pain  Descriptors / Indicators: Grimacing Pain Intervention(s): Monitored during session        Extremity/Trunk Assessment         Cervical / Trunk Assessment Cervical / Trunk Assessment: Kyphotic   Communication Communication Communication: No difficulties   Cognition Arousal/Alertness: Awake/alert Behavior During Therapy: WFL for tasks assessed/performed Overall Cognitive Status: Impaired/Different from baseline Area of Impairment: Memory;Following commands;Problem solving                       Following Commands: Follows one step commands consistently;Follows one step commands with increased time     Problem Solving: Difficulty sequencing;Requires verbal cues General Comments: pt states her phone is upstairs and there is another TV in the room.   General Comments               Home Living Family/patient expects to be discharged to:: Skilled nursing facility Living Arrangements: Alone Available Help at Discharge: Family;Available PRN/intermittently Type of Home: House       Home Layout: One level  Prior Functioning/Environment Level of Independence: Independent        Comments: was volunteer at [employer] List: Decreased strength;Decreased activity tolerance;Impaired balance (sitting and/or standing)      OT Treatment/Interventions: Self-care/ADL training;Patient/family education;Balance training;Therapeutic activities    OT Goals(Current goals can be found in the care plan section) Acute Rehab OT Goals Patient Stated Goal: feel better OT Goal Formulation: With patient Time For Goal Achievement: 08/01/16 Potential to Achieve Goals: Good  OT Frequency: Min 2X/week              AM-PAC PT "6 Clicks" Daily Activity     Outcome Measure Help from another person eating meals?: A Little Help from another person taking care of personal grooming?: A Lot Help from another person toileting, which  includes using toliet, bedpan, or urinal?: Total Help from another person bathing (including washing, rinsing, drying)?: Total Help from another person to put on and taking off regular upper body clothing?: Total Help from another person to put on and taking off regular lower body clothing?: Total 6 Click Score: 9   End of Session Nurse Communication: Mobility status  Activity Tolerance: Patient limited by fatigue Patient left: in bed;with call bell/phone within reach  OT Visit Diagnosis: Unsteadiness on feet (R26.81);Other abnormalities of gait and mobility (R26.89);Muscle weakness (generalized) (M62.81)                Time: 6384-5364 OT Time Calculation (min): 18 min Charges:  OT General Charges $OT Visit: 1 Procedure OT Evaluation $OT Eval Moderate Complexity: 1 Procedure G-Codes:     Kari Baars, [place]  Payton Mccallum D 07/18/2016, 2:58 PM

## 2016-07-18 NOTE — Clinical Social Work Note (Signed)
Clinical Social Work Assessment  Patient Details  Name: Kelly Drake MRN: [id number] Date of Birth: Mar 12, 1930  Date of referral:  07/18/16               Reason for consult:  Facility Placement                Permission sought to share information with:  Facility Art therapist granted to share information::  Yes, Verbal Permission Granted  Name::        Agency::     Relationship::     Contact Information:     Housing/Transportation Living arrangements for the past 2 months:  Single Family Home Source of Information:  Adult Children Percell Miller) Patient Interpreter Needed:  None Criminal Activity/Legal Involvement Pertinent to Current Situation/Hospitalization:    Significant Relationships:  Adult Children Lives with:  Self Do you feel safe going back to the place where you live?  No Need for family participation in patient care:  Yes (Comment)  Care giving concerns:  Patients son, Percell Miller, unsure of what SNF patient will DC too.    Social Worker assessment / plan:  Patient not oriented at present time. CSW contacted patients son, Percell Miller via phone, regarding discharge plans. Son is agreeable to patient discharging to SNF at this time. Patient and family live in [place] and would prefer a facility in that area. Son agreeable to information to be sent to [facility] as well as Insurance underwriter. CSW will provide son with bed offers once available.   Plan: CSW will complete FL2/ PASRR number/ and fax information to preferred SNF's.   Employment status:  Retired Nurse, adult PT Recommendations:  [place] / Referral to community resources:  [place]  Patient/Family's Response to care:  Son appreciated CSW.   Patient/Family's Understanding of and Emotional Response to Diagnosis, Current Treatment, and Prognosis:  Son understood current treatment and prognosis.   Emotional  Assessment Appearance:  Appears stated age Attitude/Demeanor/Rapport:  Unable to Assess Affect (typically observed):  Unable to Assess Orientation:  Oriented to Self Alcohol / Substance use:    Psych involvement (Current and /or in the community):  No (Comment)  Discharge Needs  Concerns to be addressed:  No discharge needs identified Readmission within the last 30 days:  No Current discharge risk:  None Barriers to Discharge:  No Barriers Identified   Weston Anna, LCSW 07/18/2016, 1:39 PM

## 2016-07-18 NOTE — Progress Notes (Signed)
PROGRESS NOTE  Kelly Drake DGU:[id number] DOB: [birth year] DOA: 07/24/2016 PCP: Rusty Aus, MD  HPI/Recap of past 24 hours: Patient is an 83 year old female past oral history of emphysema, B-12 deficiency and severe hiatal hernia scheduled for surgery on 6/19 cm emergency room on 6/8 for evaluation of dysphagia with solids and patient found to have intractable nausea plus vomiting and weight loss. Admitted to the hospitalist service and surgery consulted. TPN added per surgery. Patient also had confusion, worse at night. Workup negative and according to family, this had been going on for at least the last month. Patient is doing okay today with no complaints.  Assessment/Plan: Principal Problem:   Acute kidney injury ([place]): Secondary to hypovolemia and very poor oral intake. Resolved now with IV fluids. Active Problems:   HYPERTENSION, BENIGN   COPD with emphysema ([place]): Stable. Continue inhaler regimen   Hypokalemia: Replacing with IV potassium.   Dysphagia: We'll consult speech therapy. IV Pepcid. On TPN   Normocytic anemia with history of B-12 deficiency: Hemoglobin stable, on B-12 supplementation.   Hiatal hernia: Corrective surgery planned for 6/19   Prolonged QT interval on admission: Stable now, electrolytes corrected. Acute encephalopathy, likely in the setting of underlying chronic dementia: Worse at nights. Discontinued Ativan. CT negative. Lab work normal. On been ongoing now for over a month Severe protein County malnutrition/underweight: Patient meets criteria in the context of acute illness/injury. Seen by nutrition. On TPN plus ensure en live twice a day.  Code Status: Full code   Family Communication: Left message for family   Disposition Plan: Seen by PT and OT, recommending skilled nursing versus 24-hour supervision    Consultants:  Surgery   Procedures:  None   Antimicrobials: IV Unasyn 6/10-present  DVT Prophylaxis:  Heparin   Objective: Vitals:     07/17/16 2331 07/18/16 0623 07/18/16 0851 07/18/16 1334  BP: (!) 160/94 (!) 146/84  123/84  Pulse: 78 72  67  Resp: 16 16  14   Temp: 97.8 F (36.6 C) 98 F (36.7 C)  97.5 F (36.4 C)  TempSrc: Oral Oral  Axillary  SpO2: 96% 97% 98% 96%  Weight:  41.7 kg (91 lb 14.9 oz)    Height:        Intake/Output Summary (Last 24 hours) at 07/18/16 1518 Last data filed at 07/18/16 1500  Gross per 24 hour  Intake             2626 ml  Output              100 ml  Net             2526 ml   Filed Weights   07/16/16 0700 07/17/16 0357 07/18/16 0623  Weight: 41.5 kg (91 lb 7.9 oz) 42.4 kg (93 lb 7.6 oz) 41.7 kg (91 lb 14.9 oz)    Exam:   General:  Alert and oriented 2, no acute distress, interactive  HEENT: Normocephalic, a track a mucous members are slightly dry    Cardiovascular: regular rate and rhythm, S1-S2    Respiratory: poor inspiratory effort, no rales or wheezes, breathing is not labored  Abdomen: soft, nontender, nondistended, positive bowel sounds    Musculoskeletal: no clubbing or cyanosis or edema   Skin: no skin breaks, tears or lesions   Psychiatry: patient is appropriate, no evidence of psychoses     Data Reviewed: CBC:  Recent Labs Lab 07/14/16 0523 07/15/16 0519 07/16/16 0753 07/17/16 1005 07/18/16 0438  WBC 9.5  12.5* 7.6 8.7 8.4  NEUTROABS  --   --   --   --  6.3  HGB 10.2* 9.3* 8.9* 9.2* 8.6*  HCT 30.4* 28.3* 26.9* 27.0* 25.1*  MCV 85.6 87.6 85.7 84.6 83.1  PLT 255 272 255 282 774   Basic Metabolic Panel:  Recent Labs Lab 07/25/2016 2020 07/14/16 0523 07/15/16 0519 07/16/16 0753 07/17/16 1005 07/18/16 0438  NA 142 144 147* 141 137 138  K 2.7* 2.8* 4.0 3.2* 2.9* 3.1*  CL 103 109 115* 106 103 103  CO2 27 24 22 27 27 28   GLUCOSE 102* 90 109* 109* 105* 133*  BUN 52* 30* 29* 11 8 9   CREATININE 1.40* 0.97 0.85 0.62 0.74 0.62  CALCIUM 9.1 8.0* 8.2* 8.2* 8.1* 8.6*  MG 2.2  --   --   --   --  1.4*  PHOS  --   --   --   --   --  2.8    GFR: Estimated Creatinine Clearance: 34.5 mL/min (by C-G formula based on SCr of 0.62 mg/dL). Liver Function Tests:  Recent Labs Lab 07/28/2016 2020 07/18/16 0438  AST 22 20  ALT 16 15  ALKPHOS 80 63  BILITOT 1.3* 0.3  PROT 7.0 4.8*  ALBUMIN 3.8 2.3*    Recent Labs Lab 08/01/2016 2020  LIPASE 35    Recent Labs Lab 07/15/16 1103  AMMONIA <9*   Coagulation Profile: No results for input(s): INR, PROTIME in the last 168 hours. Cardiac Enzymes: No results for input(s): CKTOTAL, CKMB, CKMBINDEX, TROPONINI in the last 168 hours. BNP (last 3 results) No results for input(s): PROBNP in the last 8760 hours. HbA1C: No results for input(s): HGBA1C in the last 72 hours. CBG:  Recent Labs Lab 07/17/16 2327 07/18/16 0619 07/18/16 1220  GLUCAP 108* 137* 150*   Lipid Profile:  Recent Labs  07/18/16 0438  TRIG 100   Thyroid Function Tests: No results for input(s): TSH, T4TOTAL, FREET4, T3FREE, THYROIDAB in the last 72 hours. Anemia Panel: No results for input(s): VITAMINB12, FOLATE, FERRITIN, TIBC, IRON, RETICCTPCT in the last 72 hours. Urine analysis:    Component Value Date/Time   COLORURINE YELLOW 07/15/2016 1620   APPEARANCEUR CLEAR 07/15/2016 1620   APPEARANCEUR Clear 04/13/2016 0858   LABSPEC 1.018 07/15/2016 1620   PHURINE 5.0 07/15/2016 1620   GLUCOSEU 50 (A) 07/15/2016 1620   HGBUR NEGATIVE 07/15/2016 1620   BILIRUBINUR NEGATIVE 07/15/2016 1620   BILIRUBINUR Negative 04/13/2016 0858   KETONESUR 20 (A) 07/15/2016 1620   PROTEINUR NEGATIVE 07/15/2016 1620   NITRITE NEGATIVE 07/15/2016 1620   LEUKOCYTESUR NEGATIVE 07/15/2016 1620   LEUKOCYTESUR Negative 04/13/2016 0858   Sepsis Labs: @LABRCNTIP (procalcitonin:4,lacticidven:4)  ) Recent Results (from the past 240 hour(s))  Urine Culture     Status: None   Collection Time: 07/15/16  4:20 PM  Result Value Ref Range Status   Specimen Description URINE, CLEAN CATCH  Final   Special Requests NONE  Final    Culture   Final    NO GROWTH Performed at [facility], [place] [address] [place], [place] [address]    Report Status 07/17/2016 FINAL  Final      Studies: No results found.  Scheduled Meds: . calcium-vitamin D  2 tablet Oral Daily  . DULoxetine  20 mg Oral Daily  . famotidine  20 mg Oral Daily  . feeding supplement (ENSURE ENLIVE)  237 mL Oral BID BM  . heparin  5,000 Units Subcutaneous Q8H  .  insulin aspart  0-9 Units Subcutaneous Q6H  . mometasone-formoterol  2 puff Inhalation BID  . sodium chloride flush  10-40 mL Intracatheter Q12H  . sodium chloride flush  3 mL Intravenous Q12H  . vitamin B-12  50 mcg Oral Daily    Continuous Infusions: . ampicillin-sulbactam (UNASYN) IV Stopped (07/18/16 1511)  . dextrose 5 % with KCl 20 mEq / L 20 mEq (07/18/16 0859)  . Marland KitchenTPN (CLINIMIX-E) Adult     And  . fat emulsion    . Marland KitchenTPN (CLINIMIX-E) Adult 25 mL/hr at 07/17/16 1753     LOS: 5 days     Annita Brod, MD [facility] Pager [phone]  If 7PM-7AM, please contact night-coverage [email or web address] Password [facility] 07/18/2016, 3:18 PM

## 2016-07-18 NOTE — Progress Notes (Signed)
Nutrition Follow-up  DOCUMENTATION CODES:   Underweight, Severe malnutrition in context of acute illness/injury  INTERVENTION:   TPN per Pharmacy Continue Ensure Enlive po BID, each supplement provides 350 kcal and 20 grams of protein, as tolerated  RD to continue to monitor  NUTRITION DIAGNOSIS:   Malnutrition(severe) related to acute illness, nausea, vomiting as evidenced by percent weight loss, severe depletion of body fat, severe depletion of muscle mass.  Ongoing.  GOAL:   Patient will meet greater than or equal to 90% of their needs  Ongoing.  MONITOR:   Diet advancement, PO intake, Supplement acceptance, Weight trends, Labs, I & O's (TPN)  ASSESSMENT:   83 y.o. female with medical history significant for emphysema, B12 deficiency, hiatal hernia, and GERD, now presenting to the emergency department at the direction of her primary care physician for evaluation of dysphagia to solids with nausea, vomiting, and progressive weight loss.  Patient's confusion continues. Unable to gather diet history. Pt has been started on TPN via PICC 6/12. Goal rate is ClinimixE5/15at a goal rate of35ml/hr + 20% fat emulsion at 10 ml/hr to provide: 60 g/day protein, 1332 Kcal/day. Pt still on full liquid diet. Accepting Ensure supplements. Pt has been vomiting. Weight continues to be stable since admission, 91 lb.  Medications: OSCAL-D tablets daily, Vitamin B-12 tablet daily, D5 w/ KCl infusion at 50 ml/hr- provides 204 kcal Labs reviewed: CBGs: 137-150 Low K, Mg Phos WNL TG: 100 mg/dL  Plan per Pharmacy 6/13: Magnesium 2gm bolus KCl 10 mEq boluses hourly x4 Add Potassium to IV fluid  At 1800 today:  Continue ClinimixE5/15at 25 ml/hr.  20% fat emulsion at 86ml/hr.  6/10: - Pt currently confused and not able to provide any history. Per chart review, pt has had N/V for around 3 weeks PTA. Pt was planning to have hiatal hernia repair soon. Pt only consuming 5-10% of  meals at this time. Pt has been ordered ensure supplements, will continue. -Per chart review, pt has lost 14 lb since 3/9 (14% wt loss x 3 months, significant for time frame). Nutrition-Focused physical exam completed. Findings are severe fat depletion, severe muscle depletion, and no edema.   Diet Order:  Diet full liquid Room service appropriate? Yes; Fluid consistency: Thin TPN (CLINIMIX-E) Adult TPN (CLINIMIX-E) Adult  Skin:  Reviewed, no issues  Last BM:  6/9  Height:   Ht Readings from Last 1 Encounters:  07/20/2016 4\' 11"  (1.499 m)    Weight:   Wt Readings from Last 1 Encounters:  07/18/16 91 lb 14.9 oz (41.7 kg)    Ideal Body Weight:  44.7 kg  BMI:  Body mass index is 18.57 kg/m.  Estimated Nutritional Needs:   Kcal:  1100-1300  Protein:  55-65g  Fluid:  1.3L/day  EDUCATION NEEDS:   No education needs identified at this time  Clayton Bibles, MS, RD, LDN Pager: [phone] After Hours Pager: [phone]

## 2016-07-18 NOTE — Progress Notes (Signed)
CC:  vomiting  Subjective: She has a Associate Professor in the room and she is more confused this AM.  She knows she is in the hospital, but not which one, got the year and the president correctly identified.  She doesn't remember eating supper.  Very alert this AM.  On TNA.  Objective: Vital signs in last 24 hours: Temp:  [97.5 F (36.4 C)-98 F (36.7 C)] 98 F (36.7 C) (06/13 6433) Pulse Rate:  [72-84] 72 (06/13 0623) Resp:  [15-16] 16 (06/13 0623) BP: (146-160)/(82-94) 146/84 (06/13 0623) SpO2:  [96 %-98 %] 97 % (06/13 0623) Weight:  [41.7 kg (91 lb 14.9 oz)] 41.7 kg (91 lb 14.9 oz) (06/13 0623) Last BM Date: 07/14/16 260 PO recorded 1320 IV Urine x 4 reported Afebrile, VSS,  BP up some K+ 3.1/Mag 1.4  - both being replaced + anemia H/H down more? hydration  Intake/Output from previous day: 06/12 0701 - 06/13 0700 In: 1580.2 [P.O.:260; I.V.:820.2; IV Piggyback:500] Out: -  Intake/Output this shift: No intake/output data recorded.  General appearance: alert, cooperative, no distress and confused and cachectic Resp: clear to auscultation bilaterally GI: soft, non-tender; bowel sounds normal; no masses,  no organomegaly  Lab Results:   Recent Labs  07/17/16 1005 07/18/16 0438  WBC 8.7 8.4  HGB 9.2* 8.6*  HCT 27.0* 25.1*  PLT 282 275    BMET  Recent Labs  07/17/16 1005 07/18/16 0438  NA 137 138  K 2.9* 3.1*  CL 103 103  CO2 27 28  GLUCOSE 105* 133*  BUN 8 9  CREATININE 0.74 0.62  CALCIUM 8.1* 8.6*   PT/INR No results for input(s): LABPROT, INR in the last 72 hours.   Recent Labs Lab 07/15/2016 2020 07/18/16 0438  AST 22 20  ALT 16 15  ALKPHOS 80 63  BILITOT 1.3* 0.3  PROT 7.0 4.8*  ALBUMIN 3.8 2.3*     Lipase     Component Value Date/Time   LIPASE 35 07/11/2016 2020     Medications: . calcium-vitamin D  2 tablet Oral Daily  . DULoxetine  20 mg Oral Daily  . famotidine  20 mg Oral Daily  . feeding supplement (ENSURE ENLIVE)  237 mL  Oral BID BM  . heparin  5,000 Units Subcutaneous Q8H  . insulin aspart  0-9 Units Subcutaneous Q6H  . mometasone-formoterol  2 puff Inhalation BID  . sodium chloride flush  10-40 mL Intracatheter Q12H  . sodium chloride flush  3 mL Intravenous Q12H  . vitamin B-12  50 mcg Oral Daily   . ampicillin-sulbactam (UNASYN) IV Stopped (07/18/16 0257)  . dextrose 5 % with KCl 20 mEq / L    . magnesium sulfate 1 - 4 g bolus IVPB    . potassium chloride    . Marland KitchenTPN (CLINIMIX-E) Adult 25 mL/hr at 07/17/16 1753     Anti-infectives    Start     Dose/Rate Route Frequency Ordered Stop   07/15/16 1500  ampicillin-sulbactam (UNASYN) 1.5 g in sodium chloride 0.9 % 50 mL IVPB     1.5 g 100 mL/hr over 30 Minutes Intravenous Every 6 hours 07/15/16 1412        Assessment/Plan Hiatal hernia with nausea and vomiting - 3-4 weeks Acute encephalopaty - comes and goes Hypokalemia/hypomagnesemia - being replaced Hypertension COPD/atelectasis/?pneumonia/atelectasis/right pleural effusion Acute kidney injury Anemia Dysphagia Prolonged QT Anemia Malnutrition - full liquids/TNA BMI 18 FEN: IV fluids/full liquids ID: Unasyn 07/15/16 =>>day 4 DVT: Heparin  Plan:  On full liquid diet and  TNA.  Continue medical management and Rx of malnutrition.    LOS: 5 days    Marquist Binstock 07/18/2016 [phone]

## 2016-07-18 NOTE — Progress Notes (Signed)
[facility] NOTE   Pharmacy Consult for TPN Indication: malnutrition, HH, aspiration PNA  Patient Measurements: Height: 4\' 11"  (149.9 cm) Weight: 91 lb 14.9 oz (41.7 kg) IBW/kg (Calculated) : 43.2 TPN AdjBW (KG): 41.8 Body mass index is 18.57 kg/m.  Insulin Requirements: 1 unit/24 hr  Current Nutrition: Ensure BID. Full liquid diet, per flow sheet no meals charted  IVF: D5W at 50 ml/hr, added 20 mEq KCl per liter today 6/13  Central access: 07/17/16 TPN start date: 07/17/16  ASSESSMENT                                                                                                          HPI: Pt is a 83 yo female admitted due to dysphagia, persistent nausea, vomiting related to large hiatal hernia.   Significant events:  6/12 Start TPN  Today:   Glucose - less than 150 gm/dl   Electrolytes - K 3.1 (despite IV repletion 6/12), Magnesium 1.4, CorrCa at 10, others WNL  Renal - SCr 0.62  LFTs - WNL 6/8  TGs - 100 (6/13)  Prealbumin - 7.1 (6/11), 6.5 (6/13)  NUTRITIONAL GOALS                                                                                             RD recs: 6/10  Kcal:1100-1300 Protein:55-65g Fluid:1.3L/day  Clinimix E 5/15 at a goal rate of 50 ml/hr + 20% fat emulsion at 10 ml/hr to provide: 60 g/day protein, 1332 Kcal/day.  PLAN      Magnesium 2gm bolus KCl 10 mEq boluses hourly x4 Add Potassium to IV fluid                                                                                                                     At 1800 today:  Continue Clinimix E 5/15 at 25 ml/hr.  20% fat emulsion at 5 ml/hr.  Plan to advance as tolerated to the goal rate.  TPN to contain standard multivitamins  Trace elements MWF  Continue IV D5W at 50 ml/hr, add KCl 20 mEq per liter  Continue sensitive SSI q6h .   TPN lab panels on  Mondays & Thursdays.  BMET, Mg with AM labs   F/u  daily.  Minda Ditto PharmD Pager [phone] 07/18/2016, 7:05 AM

## 2016-07-19 ENCOUNTER — Inpatient Hospital Stay (HOSPITAL_COMMUNITY): Payer: PPO

## 2016-07-19 DIAGNOSIS — R1310 Dysphagia, unspecified: Secondary | ICD-10-CM

## 2016-07-19 DIAGNOSIS — R410 Disorientation, unspecified: Secondary | ICD-10-CM

## 2016-07-19 DIAGNOSIS — I10 Essential (primary) hypertension: Secondary | ICD-10-CM

## 2016-07-19 LAB — COMPREHENSIVE METABOLIC PANEL
ALBUMIN: 2.5 g/dL — AB (ref 3.5–5.0)
ALK PHOS: 66 U/L (ref 38–126)
ALT: 15 U/L (ref 14–54)
ANION GAP: 8 (ref 5–15)
AST: 19 U/L (ref 15–41)
BUN: 11 mg/dL (ref 6–20)
CALCIUM: 8.6 mg/dL — AB (ref 8.9–10.3)
CHLORIDE: 99 mmol/L — AB (ref 101–111)
CO2: 29 mmol/L (ref 22–32)
CREATININE: 0.69 mg/dL (ref 0.44–1.00)
GFR calc Af Amer: >60 mL/min (ref >60)
GFR calc non Af Amer: >60 mL/min (ref >60)
GLUCOSE: 135 mg/dL — AB (ref 65–99)
Potassium: 3.3 mmol/L — ABNORMAL LOW (ref 3.5–5.1)
SODIUM: 136 mmol/L (ref 135–145)
Total Bilirubin: 0.5 mg/dL (ref 0.3–1.2)
Total Protein: 5 g/dL — ABNORMAL LOW (ref 6.5–8.1)

## 2016-07-19 LAB — GLUCOSE, CAPILLARY
GLUCOSE-CAPILLARY: 152 mg/dL — AB (ref 65–99)
GLUCOSE-CAPILLARY: 135 mg/dL — AB (ref 65–99)
Glucose-Capillary: 120 mg/dL — ABNORMAL HIGH (ref 65–99)
Glucose-Capillary: 133 mg/dL — ABNORMAL HIGH (ref 65–99)
Glucose-Capillary: 159 mg/dL — ABNORMAL HIGH (ref 65–99)

## 2016-07-19 LAB — MAGNESIUM: Magnesium: 1.9 mg/dL (ref 1.7–2.4)

## 2016-07-19 LAB — PHOSPHORUS: Phosphorus: 3.4 mg/dL (ref 2.5–4.6)

## 2016-07-19 MED ORDER — POTASSIUM CHLORIDE IN NACL 20-0.9 MEQ/L-% IV SOLN
INTRAVENOUS | Status: DC
  Administered 2016-07-19 – 2016-07-20 (×2): via INTRAVENOUS
  Filled 2016-07-19 (×3): qty 1000

## 2016-07-19 MED ORDER — LORAZEPAM 2 MG/ML IJ SOLN
0.2500 mg | Freq: Once | INTRAMUSCULAR | Status: AC
  Administered 2016-07-19: 0.25 mg via INTRAVENOUS
  Filled 2016-07-19: qty 1

## 2016-07-19 MED ORDER — FAT EMULSION 20 % IV EMUL
120.0000 mL | INTRAVENOUS | Status: AC
  Administered 2016-07-19: 120 mL via INTRAVENOUS
  Filled 2016-07-19: qty 200

## 2016-07-19 MED ORDER — POTASSIUM CHLORIDE 10 MEQ/50ML IV SOLN
10.0000 meq | INTRAVENOUS | Status: AC
  Administered 2016-07-19 (×4): 10 meq via INTRAVENOUS
  Filled 2016-07-19 (×4): qty 50

## 2016-07-19 MED ORDER — VALPROATE SODIUM 500 MG/5ML IV SOLN
250.0000 mg | Freq: Two times a day (BID) | INTRAVENOUS | Status: DC
  Administered 2016-07-19 – 2016-07-24 (×12): 250 mg via INTRAVENOUS
  Filled 2016-07-19 (×13): qty 2.5

## 2016-07-19 MED ORDER — M.V.I. ADULT IV INJ
INTRAVENOUS | Status: AC
  Administered 2016-07-19: 18:00:00 via INTRAVENOUS
  Filled 2016-07-19: qty 720

## 2016-07-19 NOTE — Progress Notes (Signed)
[facility]  PROGRESS NOTE  Kelly Drake UEA:[id number] DOB: 09-Sep-1930 DOA: 07/24/2016 PCP: Rusty Aus, MD   Brief HPI:   Patient is an 83 year old female past oral history of emphysema, B-12 deficiency and severe hiatal hernia scheduled for surgery on 6/19 cm emergency room on 6/8 for evaluation of dysphagia with solids and patient found to have intractable nausea plus vomiting and weight loss. Admitted to the hospitalist service and surgery consulted. TPN added per surgery. Patient also had confusion, worse at night. Workup negative and according to family, this had been going on for at least the last month.    Subjective   This morning patient is confused, trying to get out of bed. Wants to go home. Does not know that she is in the hospital.   Assessment/Plan:     1. Delirium- worsening mental status in the setting of dementia. Patient received Ativan last night and seems to have become worse mentally. We'll start Depakote 250 g IV every 12 hours. Will avoid Haldol as patient had QT prolongation. 2. Dysphagia- speech therapy recommended GI evaluation as patient likely has esophageal dysphagia. She had an endoscopy done in March 2016 at [facility] which showed Schatzki ring which was dilated. I called and discussed with [facility], Dr Alessandra Bevels, recommends getting esophageal barium swallow. If patient able to swallow pill than no dilation required. If abnormal call GI again in a.m. Will order esophagram. 3. Acute kidney injury- secondary to hypovolemia and poor by mouth intake. Resolved with IV fluids. 4. Hypokalemia- patient on IV normal saline with KCl at 20 meq/l. Follow BMP in a.m. 5. Hiatal hernia- corrective surgery planned for 07/24/2016 as per general surgery 6. Severe protein calorie malnutrition- patient started on TPN per general surgery. 7. Prolonged QT interval- found to have prolonged QT interval on admission, resolved after correction of electrolyte  abnormalities. 8. COPD- stable, continue Dulera.    DVT prophylaxis: Heparin  Code Status: Full code  Family Communication: No family at bedside   Disposition Plan: Pending workup for dysphagia, delirium   Consultants:  Gen. surgery  Procedures:  None  Continuous infusions . 0.9 % NaCl with KCl 20 mEq / L 50 mL/hr at 07/19/16 1425  . Marland KitchenTPN (CLINIMIX-E) Adult     And  . fat emulsion    . valproate sodium Stopped (07/19/16 1115)      Antibiotics:   Anti-infectives    Start     Dose/Rate Route Frequency Ordered Stop   07/15/16 1500  ampicillin-sulbactam (UNASYN) 1.5 g in sodium chloride 0.9 % 50 mL IVPB  Status:  Discontinued     1.5 g 100 mL/hr over 30 Minutes Intravenous Every 6 hours 07/15/16 1412 07/19/16 1122       Objective   Vitals:   07/18/16 2159 07/19/16 0430 07/19/16 0435 07/19/16 1303  BP: (!) 144/78 139/78  (!) 155/78  Pulse:  80  72  Resp:  16  16  Temp:  98.1 F (36.7 C)  98 F (36.7 C)  TempSrc:  Oral  Oral  SpO2:  96%  100%  Weight:   42.1 kg (92 lb 13 oz)   Height:        Intake/Output Summary (Last 24 hours) at 07/19/16 1451 Last data filed at 07/19/16 1248  Gross per 24 hour  Intake          2937.07 ml  Output              550 ml  Net          2387.07 ml   Filed Weights   07/17/16 0357 07/18/16 0623 07/19/16 0435  Weight: 42.4 kg (93 lb 7.6 oz) 41.7 kg (91 lb 14.9 oz) 42.1 kg (92 lb 13 oz)     Physical Examination:   Physical Exam: Eyes: No icterus, extraocular muscles intact  Mouth: Oral mucosa is moist, no lesions on palate,  Neck: Supple, no deformities, masses, or tenderness Lungs: Normal respiratory effort, bilateral clear to auscultation, no crackles or wheezes.  Heart: Regular rate and rhythm, S1 and S2 normal, no murmurs, rubs auscultated Abdomen: BS normoactive,soft,nondistended,non-tender to palpation,no organomegaly Extremities: No pretibial edema, no erythema, no cyanosis, no clubbing Neuro : Confused,  alert but not oriented to place and time.  Skin: No rashes seen on exam     Data Reviewed: I have personally reviewed following labs and imaging studies  CBG:  Recent Labs Lab 07/18/16 1220 07/18/16 1808 07/19/16 0035 07/19/16 0548 07/19/16 1130  GLUCAP 150* 176* 159* 152* 135*    CBC:  Recent Labs Lab 07/14/16 0523 07/15/16 0519 07/16/16 0753 07/17/16 1005 07/18/16 0438  WBC 9.5 12.5* 7.6 8.7 8.4  NEUTROABS  --   --   --   --  6.3  HGB 10.2* 9.3* 8.9* 9.2* 8.6*  HCT 30.4* 28.3* 26.9* 27.0* 25.1*  MCV 85.6 87.6 85.7 84.6 83.1  PLT 255 272 255 282 277    Basic Metabolic Panel:  Recent Labs Lab 07/07/2016 2020  07/15/16 0519 07/16/16 0753 07/17/16 1005 07/18/16 0438 07/19/16 0441  NA 142  < > 147* 141 137 138 136  K 2.7*  < > 4.0 3.2* 2.9* 3.1* 3.3*  CL 103  < > 115* 106 103 103 99*  CO2 27  < > 22 27 27 28 29   GLUCOSE 102*  < > 109* 109* 105* 133* 135*  BUN 52*  < > 29* 11 8 9 11   CREATININE 1.40*  < > 0.85 0.62 0.74 0.62 0.69  CALCIUM 9.1  < > 8.2* 8.2* 8.1* 8.6* 8.6*  MG 2.2  --   --   --   --  1.4* 1.9  PHOS  --   --   --   --   --  2.8 3.4  < > = values in this interval not displayed.  Recent Results (from the past 240 hour(s))  Urine Culture     Status: None   Collection Time: 07/15/16  4:20 PM  Result Value Ref Range Status   Specimen Description URINE, CLEAN CATCH  Final   Special Requests NONE  Final   Culture   Final    NO GROWTH Performed at [facility], [address] [place], [place] [address]    Report Status 07/17/2016 FINAL  Final     Liver Function Tests:  Recent Labs Lab 08/03/2016 2020 07/18/16 0438 07/19/16 0441  AST 22 20 19   ALT 16 15 15   ALKPHOS 80 63 66  BILITOT 1.3* 0.3 0.5  PROT 7.0 4.8* 5.0*  ALBUMIN 3.8 2.3* 2.5*    Recent Labs Lab 07/26/2016 2020  LIPASE 35    Recent Labs Lab 07/15/16 1103  AMMONIA <9*    Cardiac Enzymes: No results for input(s): CKTOTAL, CKMB, CKMBINDEX, TROPONINI in the  last 168 hours. BNP (last 3 results) No results for input(s): BNP in the last 8760 hours.  ProBNP (last 3 results) No results for input(s): PROBNP in the last 8760 hours.  Studies: Dg Abd Portable 1v  Result Date: 07/19/2016 CLINICAL DATA:  Vomiting and mid abdominal pain EXAM: PORTABLE ABDOMEN - 1 VIEW COMPARISON:  07/14/2016 FINDINGS: Cardiomegaly with left basilar atelectasis or infiltrate. Relatively gasless abdomen. Stable calcification in the left pelvis. IMPRESSION: 1. Cardiomegaly with left basilar atelectasis or infiltrate 2. Nonspecific paucity of abdominal gas Electronically Signed   By: Donavan Foil M.D.   On: 07/19/2016 01:34    Scheduled Meds: . calcium-vitamin D  2 tablet Oral Daily  . DULoxetine  20 mg Oral Daily  . famotidine  20 mg Oral Daily  . feeding supplement (ENSURE ENLIVE)  237 mL Oral BID BM  . heparin  5,000 Units Subcutaneous Q8H  . insulin aspart  0-9 Units Subcutaneous Q6H  . mometasone-formoterol  2 puff Inhalation BID  . sodium chloride flush  10-40 mL Intracatheter Q12H  . sodium chloride flush  3 mL Intravenous Q12H  . vitamin B-12  50 mcg Oral Daily      Time spent: 25 min  [facility] Pager [phone]. If 7PM-7AM, please contact night-coverage at [email or web address], Office  [phone]  password [email or web address] 07/19/2016, 2:51 PM  LOS: 6 days

## 2016-07-19 NOTE — Progress Notes (Signed)
    CC:  vomiting  Subjective: Remains confused, in Posey belt restraint, year is 1932, she does not know where she is.    Objective: Vital signs in last 24 hours: Temp:  [97.5 F (36.4 C)-98.6 F (37 C)] 98.1 F (36.7 C) (06/14 0430) Pulse Rate:  [67-80] 80 (06/14 0430) Resp:  [14-20] 16 (06/14 0430) BP: (123-187)/(78-103) 139/78 (06/14 0430) SpO2:  [96 %-98 %] 96 % (06/14 0430) Weight:  [42.1 kg (92 lb 13 oz)] 42.1 kg (92 lb 13 oz) (06/14 0435) Last BM Date: 07/14/16 240 PO 1400 IV Urine 100 recorded Emesis x 3 recorded Afebrile, VSS K= 3.3  Intake/Output from previous day: 06/13 0701 - 06/14 0700 In: 2634.6 [P.O.:240; I.V.:1844.6; IV Piggyback:150] Out: 100 [Urine:100] Intake/Output this shift: Total I/O In: 250 [IV Piggyback:250] Out: 550 [Urine:550]  General appearance: alert, cooperative and no distress Resp: clear to auscultation bilaterally GI: soft, non-tender; bowel sounds normal; no masses,  no organomegaly  Lab Results:   Recent Labs  07/17/16 1005 07/18/16 0438  WBC 8.7 8.4  HGB 9.2* 8.6*  HCT 27.0* 25.1*  PLT 282 275    BMET  Recent Labs  07/18/16 0438 07/19/16 0441  NA 138 136  K 3.1* 3.3*  CL 103 99*  CO2 28 29  GLUCOSE 133* 135*  BUN 9 11  CREATININE 0.62 0.69  CALCIUM 8.6* 8.6*   PT/INR No results for input(s): LABPROT, INR in the last 72 hours.   Recent Labs Lab 07/14/2016 2020 07/18/16 0438 07/19/16 0441  AST 22 20 19   ALT 16 15 15   ALKPHOS 80 63 66  BILITOT 1.3* 0.3 0.5  PROT 7.0 4.8* 5.0*  ALBUMIN 3.8 2.3* 2.5*     Lipase     Component Value Date/Time   LIPASE 35 07/24/2016 2020     Medications: . calcium-vitamin D  2 tablet Oral Daily  . DULoxetine  20 mg Oral Daily  . famotidine  20 mg Oral Daily  . feeding supplement (ENSURE ENLIVE)  237 mL Oral BID BM  . heparin  5,000 Units Subcutaneous Q8H  . insulin aspart  0-9 Units Subcutaneous Q6H  . mometasone-formoterol  2 puff Inhalation BID  . sodium  chloride flush  10-40 mL Intracatheter Q12H  . sodium chloride flush  3 mL Intravenous Q12H  . vitamin B-12  50 mcg Oral Daily    Assessment/Plan Hiatal hernia with nausea and vomiting - 3-4 weeks Acute encephalopaty - comes and goes Hypokalemia/hypomagnesemia - being replaced Hypertension COPD/atelectasis/?pneumonia/atelectasis/right pleural effusion Acute kidney injury Anemia Dysphagia Prolonged QT Anemia Malnutrition - full liquids/TNA BMI 18 FEN: IV fluids/full liquids ID: Unasyn 07/15/16 =>>day 5 DVT: Heparin    Plan:  Not sure about vomiting, she can't really answer.  Continue current medical management.  LOS: 6 days    Ilario Dhaliwal 07/19/2016 [phone]

## 2016-07-19 NOTE — Progress Notes (Signed)
SLP Cancellation Note  Patient Details Name: Kelly Drake MRN: [id number] DOB: 1930-12-27   Cancelled treatment:       Reason Eval/Treat Not Completed: Other (comment) Despite liquid diet and precautions, pt not tolerating liquid diet with ongoing regurgitation/vomiting, see prior SLP note. Discussed with MD who will consider possible GI referral. No further SLP interventions warranted, will sign off.    Cristen Bredeson, Katherene Ponto 07/19/2016, 11:31 AM

## 2016-07-19 NOTE — Progress Notes (Addendum)
[place] NOTE   Pharmacy Consult for TPN Indication: malnutrition, HH, aspiration PNA?  Patient Measurements: Height: 4\' 11"  (149.9 cm) Weight: 92 lb 13 oz (42.1 kg) IBW/kg (Calculated) : 43.2 TPN AdjBW (KG): 41.8 Body mass index is 18.75 kg/m.  Insulin Requirements: 5 units per 24 hr  Current Nutrition: Ensure BID charted, question how much consumed, patient more confused today. Full liquid diet, per flow sheet no meals charted  IVF: D5W at 50 ml/hr, added 20 mEq KCl per liter  Central access: 07/17/16 TPN start date: 07/17/16  ASSESSMENT                                                                                                          HPI: Pt is a 83 yo female admitted due to dysphagia, persistent nausea, vomiting related to large hiatal hernia.   Significant events:  6/12 Start TPN  Today:   Glucose - elevated, CBG 176   Electrolytes - K 3.3 (despite IV repletion 6/12), Magnesium repleted, CorrCa at 10  Renal - SCr 0.69  LFTs - WNL 6/8  TGs - 100 (6/13)  Prealbumin - 7.1 (6/11), 6.5 (6/13)  NUTRITIONAL GOALS                                                                                             RD recs: 6/10  Kcal:1100-1300 Protein:55-65g Fluid:1.3L/day  Clinimix E 5/15 at a goal rate of 50 ml/hr + 20% fat emulsion at 10 ml/hr to provide: 60 g/day protein, 1332 Kcal/day.  PLAN      KCl 10 mEq boluses hourly x4                                                                                                                    At 1800 today:  Increase Clinimix E 5/15 to 30 ml/hr.  20% fat emulsion at 5 ml/hr.  Plan to advance as tolerated to the goal rate.  TPN to contain standard multivitamins daily  Trace elements MWF  Change IV to NS with 20 mEq Kcl at 50 ml/hr  Continue sensitive SSI q6h .   TPN lab panels on Mondays & Thursdays.  BMET, Mg with AM labs   F/u  daily.  Minda Ditto PharmD Pager [phone] 07/19/2016, 7:12 AM

## 2016-07-19 NOTE — Progress Notes (Signed)
Spoke w/ son Celene Skeen this am r/t pt very agitated unable to follow commands very unsteady as we got her to [facility] x2  and MD in to see pt soft belt initiated  [employer] as well.Will closely monitor

## 2016-07-19 NOTE — Progress Notes (Signed)
Pt vomited twice during shift with no relief from Zofran. On call made aware. New orders for compazine and abd xray placed. Will continue to monitor.

## 2016-07-19 NOTE — Progress Notes (Signed)
Physical Therapy Treatment Patient Details Name: Kelly Drake MRN: [id number] DOB: [birth year] Today's Date: 07/19/2016    History of Present Illness Kelly Drake is a 83 y.o. female with medical history significant for emphysema, B12 deficiency, hiatal hernia, and GERD, now presenting to the emergency department at the direction of her primary care physician for evaluation of dysphagia to solids with nausea, vomiting, and progressive weight loss.  Found to have encephalopathy due to UTI.    PT Comments    Pt in bed trying to crawl OOB AxO to self only.  + 2 assist OOB to amb to [facility]  Which was placed 3 feet away.  Very unsteady gait with B hips and knees flex, scissored feet and inabilty to functionally use a walker dur to impaired cognition.  Pt did void.  Assisted with hygiene then amb another 3 feet back to bed.  Assisted back to bed and reapplied posey waist.  Pt appeared more calm and satisfied.  Tele sitter in place.  Follow Up Recommendations  SNF;Supervision/Assistance - 24 hour     Equipment Recommendations  Rolling walker with 5" wheels    Recommendations for Other Services       Precautions / Restrictions Precautions Precautions: Fall Precaution Comments: tele sitter, waist posey Restrictions Weight Bearing Restrictions: No    Mobility  Bed Mobility Overal bed mobility: Needs Assistance Bed Mobility: Supine to Sit;Sit to Supine     Supine to sit: Max assist;+2 for physical assistance;+2 for safety/equipment Sit to supine: Max assist;+2 for physical assistance;+2 for safety/equipment   General bed mobility comments: repeat functional VC's and increased time to process.   Transfers Overall transfer level: Needs assistance Equipment used: Rolling walker (2 wheeled);None Transfers: Sit to/from [employer] to Stand: Max assist;Total assist;+2 physical assistance;+2 safety/equipment Stand pivot transfers: Max assist;Total assist;+2 physical  assistance;+2 safety/equipment       General transfer comment: partial upright posture, hand over hand VC's proper hand placement on ewalker, tactile cues for turn completion to [facility], flex hips and knees and scissored feet.  Ambulation/Gait Ambulation/Gait assistance: Max assist;Total assist;+2 physical assistance;+2 safety/equipment Ambulation Distance (Feet): 6 Feet (3 feet to [facility] then another 3 feet back to bed) Assistive device: Rolling walker (2 wheeled);2 person hand held assist Gait Pattern/deviations: Step-to pattern;Decreased stride length;Shuffle;Festinating;Narrow base of support;Decreased step length - left;Decreased step length - right;Scissoring Gait velocity: decreased   General Gait Details: very unsteady gait with waker and with + 2 HHA.  Flex hips and knees and scissored feet.     Stairs            Wheelchair Mobility    Modified Rankin (Stroke Patients Only)       Balance                                            Cognition Arousal/Alertness: Awake/alert (AxO to self only) Behavior During Therapy: Restless Overall Cognitive Status: Impaired/Different from baseline Area of Impairment: Memory;Following commands;Problem solving                       Following Commands: Follows one step commands inconsistently       General Comments: trying to crawl OOB.  Restless.  "no need to vaccumm, I'll do it later"      Exercises      General Comments  Pertinent Vitals/Pain Faces Pain Scale: Hurts a little bit Pain Location: unspecified location Pain Descriptors / Indicators: Grimacing Pain Intervention(s): Monitored during session;Repositioned    Home Living                      Prior Function            PT Goals (current goals can now be found in the care plan section) Progress towards PT goals: Progressing toward goals    Frequency    Min 3X/week      PT Plan Current plan remains appropriate     Co-evaluation              AM-PAC PT "6 Clicks" Daily Activity  Outcome Measure  Difficulty turning over in bed (including adjusting bedclothes, sheets and blankets)?: Total Difficulty moving from lying on back to sitting on the side of the bed? : Total Difficulty sitting down on and standing up from a chair with arms (e.g., wheelchair, bedside commode, etc,.)?: Total Help needed moving to and from a bed to chair (including a wheelchair)?: Total Help needed walking in hospital room?: Total Help needed climbing 3-5 steps with a railing? : Total 6 Click Score: 6    End of Session Equipment Utilized During Treatment: Gait belt Activity Tolerance: Other (comment) (mental satus) Patient left: in bed;with call bell/phone within reach;with bed alarm set;with restraints reapplied;Other (comment) Chief Operating Officer) Nurse Communication: Mobility status PT Visit Diagnosis: Muscle weakness (generalized) (M62.81);Other symptoms and signs involving the nervous system (R29.898);Other abnormalities of gait and mobility (R26.89)     Time: 1530-1555 PT Time Calculation (min) (ACUTE ONLY): 25 min  Charges:  $Gait Training: 8-22 mins $Therapeutic Activity: 8-22 mins                    G Codes:       Rica Koyanagi  PTA [facility]  Acute  Rehab Pager      [phone]

## 2016-07-20 DIAGNOSIS — Z8719 Personal history of other diseases of the digestive system: Secondary | ICD-10-CM

## 2016-07-20 LAB — COMPREHENSIVE METABOLIC PANEL
ALBUMIN: 2.5 g/dL — AB (ref 3.5–5.0)
ALT: 18 U/L (ref 14–54)
AST: 22 U/L (ref 15–41)
Alkaline Phosphatase: 70 U/L (ref 38–126)
Anion gap: 8 (ref 5–15)
BUN: 17 mg/dL (ref 6–20)
CHLORIDE: 101 mmol/L (ref 101–111)
CO2: 26 mmol/L (ref 22–32)
CREATININE: 0.76 mg/dL (ref 0.44–1.00)
Calcium: 9.2 mg/dL (ref 8.9–10.3)
GFR calc Af Amer: >60 mL/min (ref >60)
GFR calc non Af Amer: >60 mL/min (ref >60)
GLUCOSE: 103 mg/dL — AB (ref 65–99)
Potassium: 4.2 mmol/L (ref 3.5–5.1)
SODIUM: 135 mmol/L (ref 135–145)
Total Bilirubin: 0.5 mg/dL (ref 0.3–1.2)
Total Protein: 5.3 g/dL — ABNORMAL LOW (ref 6.5–8.1)

## 2016-07-20 LAB — GLUCOSE, CAPILLARY
GLUCOSE-CAPILLARY: 110 mg/dL — AB (ref 65–99)
Glucose-Capillary: 129 mg/dL — ABNORMAL HIGH (ref 65–99)
Glucose-Capillary: 134 mg/dL — ABNORMAL HIGH (ref 65–99)

## 2016-07-20 LAB — CBC WITH DIFFERENTIAL/PLATELET
BASOS ABS: 0 10*3/uL (ref 0.0–0.1)
BASOS PCT: 0 %
Eosinophils Absolute: 0 10*3/uL (ref 0.0–0.7)
Eosinophils Relative: 0 %
HEMATOCRIT: 25.8 % — AB (ref 36.0–46.0)
HEMOGLOBIN: 8.8 g/dL — AB (ref 12.0–15.0)
LYMPHS PCT: 5 %
Lymphs Abs: 1.3 10*3/uL (ref 0.7–4.0)
MCH: 29.4 pg (ref 26.0–34.0)
MCHC: 34.1 g/dL (ref 30.0–36.0)
MCV: 86.3 fL (ref 78.0–100.0)
MONOS PCT: 3 %
Monocytes Absolute: 0.8 10*3/uL (ref 0.1–1.0)
NEUTROS ABS: 23.8 10*3/uL — AB (ref 1.7–7.7)
NEUTROS PCT: 92 %
Platelets: 315 10*3/uL (ref 150–400)
RBC: 2.99 MIL/uL — ABNORMAL LOW (ref 3.87–5.11)
RDW: 14.5 % (ref 11.5–15.5)
WBC: 25.9 10*3/uL — ABNORMAL HIGH (ref 4.0–10.5)

## 2016-07-20 LAB — CBC
HCT: 26.8 % — ABNORMAL LOW (ref 36.0–46.0)
Hemoglobin: 9 g/dL — ABNORMAL LOW (ref 12.0–15.0)
MCH: 29.2 pg (ref 26.0–34.0)
MCHC: 33.6 g/dL (ref 30.0–36.0)
MCV: 87 fL (ref 78.0–100.0)
PLATELETS: 341 10*3/uL (ref 150–400)
RBC: 3.08 MIL/uL — AB (ref 3.87–5.11)
RDW: 14.5 % (ref 11.5–15.5)
WBC: 32 10*3/uL — ABNORMAL HIGH (ref 4.0–10.5)

## 2016-07-20 MED ORDER — METOPROLOL TARTRATE 12.5 MG HALF TABLET
12.5000 mg | ORAL_TABLET | Freq: Two times a day (BID) | ORAL | Status: DC
  Administered 2016-07-20: 12.5 mg via ORAL
  Filled 2016-07-20 (×2): qty 1

## 2016-07-20 MED ORDER — FAT EMULSION 20 % IV EMUL
240.0000 mL | INTRAVENOUS | Status: DC
  Administered 2016-07-20: 240 mL via INTRAVENOUS
  Filled 2016-07-20: qty 250

## 2016-07-20 MED ORDER — TRACE MINERALS CR-CU-MN-SE-ZN 10-1000-500-60 MCG/ML IV SOLN
INTRAVENOUS | Status: DC
  Administered 2016-07-20: 17:00:00 via INTRAVENOUS
  Filled 2016-07-20: qty 960

## 2016-07-20 NOTE — Progress Notes (Signed)
Pt converted from SR to A Fib. No history of A Fib noted. Pt is asymptomatic. MD notified. Will continue to monitor.

## 2016-07-20 NOTE — Progress Notes (Signed)
Pt had 20 beats SVT. Asymptomatic. MD notified. Will continue to monitor.

## 2016-07-20 NOTE — Progress Notes (Addendum)
[facility]  PROGRESS NOTE  Kelly Drake KNL:[id number] DOB: 03-May-1930 DOA: 07/26/2016 PCP: Rusty Aus, MD   Brief HPI:   Patient is an 83 year old female past oral history of emphysema, B-12 deficiency and severe hiatal hernia scheduled for surgery on 6/19 cm emergency room on 6/8 for evaluation of dysphagia with solids and patient found to have intractable nausea plus vomiting and weight loss. Admitted to the hospitalist service and surgery consulted. TPN added per surgery. Patient also had confusion, worse at night. Workup negative and according to family, this had been going on for at least the last month.    Subjective   This morning patient is a calm, answering questions appropriately. Though she is pleasantly confused.   Assessment/Plan:     1. Delirium-Improving, patient was started on Depakote 250 mg IV every 12 hours. Patient has underlying dementia.  2. Dysphagia- speech therapy recommended GI evaluation as patient likely has esophageal dysphagia. She had an endoscopy done in March 2016 at [facility] which showed Schatzki ring which was dilated. I called and discussed with [facility], Dr Alessandra Bevels, recommends getting esophageal barium swallow. If patient able to swallow pill than no dilation required. Esophagram was ordered but was canceled after surgery, Dr. Hassell Done recommended to nursing staff , no further evaluation as patient does have known hiatal hernia. Will not pursue any imaging studies further. 3. Acute kidney injury- secondary to hypovolemia and poor by mouth intake. Resolved with IV fluids. 4. Hypokalemia-resolved, today potassium is 4.2 patient on IV normal saline with KCl at 20 meq/l. Follow BMP in a.m. 5. SVT- patient a 20 beat run of SVT on telemetry. Currently back to normal sinus rhythm. We'll start low-dose metoprolol 12.5 g twice a day as blood pressure is also mildly elevated. 6. Hypertension- mild, as above will start metoprolol 12.5 mg twice a  day. 7. Leukocytosis-patient has significant leukocytosis with WBC 32,000,  She has been afebrile. No clear source of infection. ? Lab error. We'll repeat CBC today. Unasyn was discontinued yesterday after 5 days treatment of ? Aspiration pneumonia. 8. Hiatal hernia- corrective surgery planned for 07/24/2016 as per general surgery 9. Severe protein calorie malnutrition- patient started on TPN per general surgery. 10. Prolonged QT interval- found to have prolonged QT interval on admission, resolved after correction of electrolyte abnormalities. 11. COPD- stable, continue Dulera.    DVT prophylaxis: Heparin  Code Status: Full code  Family Communication: No family at bedside   Disposition Plan: Pending workup for dysphagia, delirium   Consultants:  Gen. surgery  Procedures:  None  Continuous infusions . 0.9 % NaCl with KCl 20 mEq / L 50 mL/hr at 07/20/16 1156  . Marland KitchenTPN (CLINIMIX-E) Adult     And  . fat emulsion    . Marland KitchenTPN (CLINIMIX-E) Adult 30 mL/hr at 07/19/16 1758  . valproate sodium Stopped (07/20/16 1057)      Antibiotics:   Anti-infectives    Start     Dose/Rate Route Frequency Ordered Stop   07/15/16 1500  ampicillin-sulbactam (UNASYN) 1.5 g in sodium chloride 0.9 % 50 mL IVPB  Status:  Discontinued     1.5 g 100 mL/hr over 30 Minutes Intravenous Every 6 hours 07/15/16 1412 07/19/16 1122       Objective   Vitals:   07/19/16 1303 07/19/16 2022 07/20/16 0430 07/20/16 0500  BP: (!) 155/78 (!) 150/79 124/66   Pulse: 72 94 75   Resp: 16 16 16    Temp: 98 F (36.7 C)  97.9 F (36.6 C) 98 F (36.7 C)   TempSrc: Oral Axillary Axillary   SpO2: 100% 93% 92%   Weight:    42.3 kg (93 lb 4.1 oz)  Height:        Intake/Output Summary (Last 24 hours) at 07/20/16 1248 Last data filed at 07/20/16 0930  Gross per 24 hour  Intake          1262.83 ml  Output              200 ml  Net          1062.83 ml   Filed Weights   07/18/16 0623 07/19/16 0435 07/20/16 0500   Weight: 41.7 kg (91 lb 14.9 oz) 42.1 kg (92 lb 13 oz) 42.3 kg (93 lb 4.1 oz)     Physical Examination:   Physical Exam: Eyes: No icterus, extraocular muscles intact  Mouth: Oral mucosa is moist, no lesions on palate,  Neck: Supple, no deformities, masses, or tenderness Lungs: Normal respiratory effort, bilateral clear to auscultation, no crackles or wheezes.  Heart: Regular rate and rhythm, S1 and S2 normal, no murmurs, rubs auscultated Abdomen: BS normoactive,soft,nondistended,non-tender to palpation,no organomegaly Extremities: No pretibial edema, no erythema, no cyanosis, no clubbing Neuro : Alert, pleasantly confused    Skin: No rashes seen on exam     Data Reviewed: I have personally reviewed following labs and imaging studies  CBG:  Recent Labs Lab 07/19/16 1130 07/19/16 1651 07/19/16 2347 07/20/16 0456 07/20/16 1142  GLUCAP 135* 120* 133* 129* 134*    CBC:  Recent Labs Lab 07/15/16 0519 07/16/16 0753 07/17/16 1005 07/18/16 0438 07/20/16 0439  WBC 12.5* 7.6 8.7 8.4 32.0*  NEUTROABS  --   --   --  6.3  --   HGB 9.3* 8.9* 9.2* 8.6* 9.0*  HCT 28.3* 26.9* 27.0* 25.1* 26.8*  MCV 87.6 85.7 84.6 83.1 87.0  PLT 272 255 282 275 962    Basic Metabolic Panel:  Recent Labs Lab 07/28/2016 2020  07/16/16 0753 07/17/16 1005 07/18/16 0438 07/19/16 0441 07/20/16 0439  NA 142  < > 141 137 138 136 135  K 2.7*  < > 3.2* 2.9* 3.1* 3.3* 4.2  CL 103  < > 106 103 103 99* 101  CO2 27  < > 27 27 28 29 26   GLUCOSE 102*  < > 109* 105* 133* 135* 103*  BUN 52*  < > 11 8 9 11 17   CREATININE 2.29*  < > 0.62 0.74 0.62 0.69 0.76  CALCIUM 9.1  < > 8.2* 8.1* 8.6* 8.6* 9.2  MG 2.2  --   --   --  1.4* 1.9  --   PHOS  --   --   --   --  2.8 3.4  --   < > = values in this interval not displayed.  Recent Results (from the past 240 hour(s))  Urine Culture     Status: None   Collection Time: 07/15/16  4:20 PM  Result Value Ref Range Status   Specimen Description URINE, CLEAN  CATCH  Final   Special Requests NONE  Final   Culture   Final    NO GROWTH Performed at [facility], [address] [place],  [address]    Report Status 07/17/2016 FINAL  Final     Liver Function Tests:  Recent Labs Lab 07/24/2016 2020 07/18/16 0438 07/19/16 0441 07/20/16 0439  AST 22 20 19 22   ALT 16 15 15  18  ALKPHOS 80 63 66 70  BILITOT 1.3* 0.3 0.5 0.5  PROT 7.0 4.8* 5.0* 5.3*  ALBUMIN 3.8 2.3* 2.5* 2.5*    Recent Labs Lab 07/28/2016 2020  LIPASE 35    Recent Labs Lab 07/15/16 1103  AMMONIA <9*    Cardiac Enzymes: No results for input(s): CKTOTAL, CKMB, CKMBINDEX, TROPONINI in the last 168 hours. BNP (last 3 results) No results for input(s): BNP in the last 8760 hours.  ProBNP (last 3 results) No results for input(s): PROBNP in the last 8760 hours.    Studies: Dg Abd Portable 1v  Result Date: 07/19/2016 CLINICAL DATA:  Vomiting and mid abdominal pain EXAM: PORTABLE ABDOMEN - 1 VIEW COMPARISON:  07/14/2016 FINDINGS: Cardiomegaly with left basilar atelectasis or infiltrate. Relatively gasless abdomen. Stable calcification in the left pelvis. IMPRESSION: 1. Cardiomegaly with left basilar atelectasis or infiltrate 2. Nonspecific paucity of abdominal gas Electronically Signed   By: Donavan Foil M.D.   On: 07/19/2016 01:34    Scheduled Meds: . calcium-vitamin D  2 tablet Oral Daily  . DULoxetine  20 mg Oral Daily  . famotidine  20 mg Oral Daily  . feeding supplement (ENSURE ENLIVE)  237 mL Oral BID BM  . heparin  5,000 Units Subcutaneous Q8H  . insulin aspart  0-9 Units Subcutaneous Q6H  . mometasone-formoterol  2 puff Inhalation BID  . sodium chloride flush  10-40 mL Intracatheter Q12H  . sodium chloride flush  3 mL Intravenous Q12H  . vitamin B-12  50 mcg Oral Daily      Time spent: 25 min  [facility] Pager [phone]. If 7PM-7AM, please contact night-coverage at [email or web address], Office  [phone]  password  TRH1 07/20/2016, 12:48 PM  LOS: 7 days

## 2016-07-20 NOTE — Progress Notes (Signed)
CSW spoke with patient son, and inform about bed offers for SNF rehab. CSW left copy of bed offers in patient room for son. CSW will follow up with patient once choice is made.  Kathrin Greathouse, Latanya Presser, MSW Clinical Social Worker 5E and Psychiatric Service Line [phone] 07/20/2016  12:52 PM

## 2016-07-20 NOTE — Progress Notes (Addendum)
[place] NOTE   Pharmacy Consult for TPN Indication: malnutrition, HH, aspiration PNA?  Patient Measurements: Height: 4\' 11"  (149.9 cm) Weight: 93 lb 4.1 oz (42.3 kg) IBW/kg (Calculated) : 43.2 TPN AdjBW (KG): 41.8 Body mass index is 18.84 kg/m.  Insulin Requirements: 3 units per 24 hr  Current Nutrition: Ensure BID: 1 of 2 doses taken yesterday, Full liquid diet ordered, per flow sheet no intake charted; per SLP note: pt not tolerating liquid diet with ongoing regurgitation/vomiting  IVF: NS 20 K at 50 ml/hr  Central access: 07/17/16 TPN start date: 07/17/16  ASSESSMENT                                                                                                          HPI: Pt is a 83 yo female admitted due to dysphagia, persistent nausea, vomiting related to large hiatal hernia. Scheduled for hernia repair 07/24/16  Significant events:  6/12 Start TPN  Today:    WBC up to 32  Glucose - all CBGs < 150, 3 U SSI/24 hrs  Electrolytes - K  4.1 after 4 runs and 20K/l in IVF at 50 ml/hr,    Renal - SCr WNL  LFTs - WNL   TGs - 100 (6/13)  Prealbumin - 7.1 (6/11), 6.5 (6/13)  Pt with dysphagia, MD to ordered esophagram then dc'd it;  pt taking PO pepcid and vitamin b12  NUTRITIONAL GOALS                                                                                             RD recs: 6/10  Kcal:1100-1300 Protein:55-65g Fluid:1.3L/day  Clinimix E 5/15 at a goal rate of 50 ml/hr + 20% fat emulsion at 10 ml/hr to provide: 60 g/day protein, 1332 Kcal/day.  PLAN                                                                                                                        At 1800 today:  Increase Clinimix E 5/15 to 40 ml/hr.  20% fat emulsion at 20 ml/hr x 12 hrs  This provides 1162 kcals and 48 gm protein for  100% kcal goal and 87% protein goal  TPN to contain standard multivitamins  daily  Trace elements MWF  IVF NS20 mEq Kcl at 50 ml/hr or per MD  Continue sensitive SSI q6h .   Oral pepcid  TPN lab panels on Mondays & Thursdays.  BMET, Mg with AM labs   F/u plans to eval dysphagia  F/u daily.  Eudelia Bunch, Pharm.D. [phone] 07/20/2016 7:58 AM

## 2016-07-21 ENCOUNTER — Inpatient Hospital Stay (HOSPITAL_COMMUNITY): Payer: PPO

## 2016-07-21 ENCOUNTER — Inpatient Hospital Stay (HOSPITAL_COMMUNITY): Payer: PPO | Admitting: Certified Registered Nurse Anesthetist

## 2016-07-21 DIAGNOSIS — J9621 Acute and chronic respiratory failure with hypoxia: Secondary | ICD-10-CM

## 2016-07-21 DIAGNOSIS — A419 Sepsis, unspecified organism: Secondary | ICD-10-CM

## 2016-07-21 LAB — GLUCOSE, CAPILLARY
GLUCOSE-CAPILLARY: 128 mg/dL — AB (ref 65–99)
Glucose-Capillary: 115 mg/dL — ABNORMAL HIGH (ref 65–99)
Glucose-Capillary: 79 mg/dL (ref 65–99)
Glucose-Capillary: 83 mg/dL (ref 65–99)

## 2016-07-21 LAB — HEPATIC FUNCTION PANEL
ALBUMIN: 2.2 g/dL — AB (ref 3.5–5.0)
ALK PHOS: 85 U/L (ref 38–126)
ALT: 20 U/L (ref 14–54)
AST: 27 U/L (ref 15–41)
BILIRUBIN DIRECT: 0.1 mg/dL (ref 0.1–0.5)
BILIRUBIN INDIRECT: 0.2 mg/dL — AB (ref 0.3–0.9)
BILIRUBIN TOTAL: 0.3 mg/dL (ref 0.3–1.2)
Total Protein: 5.1 g/dL — ABNORMAL LOW (ref 6.5–8.1)

## 2016-07-21 LAB — BLOOD GAS, ARTERIAL
ACID-BASE DEFICIT: 5.7 mmol/L — AB (ref 0.0–2.0)
ACID-BASE DEFICIT: 6.9 mmol/L — AB (ref 0.0–2.0)
Acid-base deficit: 6.1 mmol/L — ABNORMAL HIGH (ref 0.0–2.0)
BICARBONATE: 21.4 mmol/L (ref 20.0–28.0)
BICARBONATE: 22 mmol/L (ref 20.0–28.0)
Bicarbonate: 24.6 mmol/L (ref 20.0–28.0)
COLLECTION SITE: 11249
Drawn by: 317771
Drawn by: 317771
FIO2: 1
FIO2: 1
FIO2: 60
LHR: 18 {breaths}/min
LHR: 20 {breaths}/min
O2 SAT: 88 %
O2 SAT: 89.7 %
O2 SAT: 97 %
PATIENT TEMPERATURE: 97.7
PATIENT TEMPERATURE: 97.5
PCO2 ART: 58.7 mmHg — AB (ref 32.0–48.0)
PCO2 ART: 60.2 mmHg — AB (ref 32.0–48.0)
PCO2 ART: 80.4 mmHg — AB (ref 32.0–48.0)
PEEP/CPAP: 5 cmH2O
PEEP: 8 cmH2O
PH ART: 7.194 — AB (ref 7.350–7.450)
PO2 ART: 76.7 mmHg — AB (ref 83.0–108.0)
Patient temperature: 97.7
VT: 500 mL
VT: 500 mL
pH, Arterial: 7.109 — CL (ref 7.350–7.450)
pH, Arterial: 7.173 — CL (ref 7.350–7.450)
pO2, Arterial: 73.1 mmHg — ABNORMAL LOW (ref 83.0–108.0)
pO2, Arterial: 112 mmHg — ABNORMAL HIGH (ref 83.0–108.0)

## 2016-07-21 LAB — BASIC METABOLIC PANEL
ANION GAP: 5 (ref 5–15)
Anion gap: 8 (ref 5–15)
BUN: 20 mg/dL (ref 6–20)
BUN: 27 mg/dL — ABNORMAL HIGH (ref 6–20)
CALCIUM: 6.8 mg/dL — AB (ref 8.9–10.3)
CALCIUM: 8.7 mg/dL — AB (ref 8.9–10.3)
CHLORIDE: 113 mmol/L — AB (ref 101–111)
CO2: 20 mmol/L — ABNORMAL LOW (ref 22–32)
CO2: 23 mmol/L (ref 22–32)
CREATININE: 0.66 mg/dL (ref 0.44–1.00)
CREATININE: 0.93 mg/dL (ref 0.44–1.00)
Chloride: 104 mmol/L (ref 101–111)
GFR calc Af Amer: >60 mL/min (ref >60)
GFR calc non Af Amer: >60 mL/min (ref >60)
GFR calc non Af Amer: 55 mL/min — ABNORMAL LOW (ref >60)
GLUCOSE: 121 mg/dL — AB (ref 65–99)
Glucose, Bld: 169 mg/dL — ABNORMAL HIGH (ref 65–99)
Potassium: 4.1 mmol/L (ref 3.5–5.1)
Potassium: 4.9 mmol/L (ref 3.5–5.1)
SODIUM: 138 mmol/L (ref 135–145)
Sodium: 135 mmol/L (ref 135–145)

## 2016-07-21 LAB — URINALYSIS, ROUTINE W REFLEX MICROSCOPIC
BILIRUBIN URINE: NEGATIVE
GLUCOSE, UA: 50 mg/dL — AB
HGB URINE DIPSTICK: NEGATIVE
KETONES UR: NEGATIVE mg/dL
Leukocytes, UA: NEGATIVE
Nitrite: NEGATIVE
Protein, ur: NEGATIVE mg/dL
SPECIFIC GRAVITY, URINE: 1.015 (ref 1.005–1.030)
pH: 6 (ref 5.0–8.0)

## 2016-07-21 LAB — CBC WITH DIFFERENTIAL/PLATELET
BASOS PCT: 0 %
Basophils Absolute: 0 10*3/uL (ref 0.0–0.1)
EOS ABS: 0 10*3/uL (ref 0.0–0.7)
Eosinophils Relative: 0 %
HEMATOCRIT: 29 % — AB (ref 36.0–46.0)
Hemoglobin: 9.3 g/dL — ABNORMAL LOW (ref 12.0–15.0)
LYMPHS ABS: 0.6 10*3/uL — AB (ref 0.7–4.0)
Lymphocytes Relative: 5 %
MCH: 28.5 pg (ref 26.0–34.0)
MCHC: 32.1 g/dL (ref 30.0–36.0)
MCV: 89 fL (ref 78.0–100.0)
MONO ABS: 0.4 10*3/uL (ref 0.1–1.0)
MONOS PCT: 4 %
Neutro Abs: 9.8 10*3/uL — ABNORMAL HIGH (ref 1.7–7.7)
Neutrophils Relative %: 91 %
Platelets: 366 10*3/uL (ref 150–400)
RBC: 3.26 MIL/uL — ABNORMAL LOW (ref 3.87–5.11)
RDW: 14.4 % (ref 11.5–15.5)
WBC: 10.8 10*3/uL — ABNORMAL HIGH (ref 4.0–10.5)

## 2016-07-21 LAB — LACTIC ACID, PLASMA
LACTIC ACID, VENOUS: 1.5 mmol/L (ref 0.5–1.9)
LACTIC ACID, VENOUS: 2 mmol/L — AB (ref 0.5–1.9)

## 2016-07-21 LAB — EXPECTORATED SPUTUM ASSESSMENT W GRAM STAIN, RFLX TO RESP C

## 2016-07-21 LAB — PROCALCITONIN: Procalcitonin: 4.19 ng/mL

## 2016-07-21 LAB — MRSA PCR SCREENING: MRSA BY PCR: NEGATIVE

## 2016-07-21 LAB — MAGNESIUM: MAGNESIUM: 1.6 mg/dL — AB (ref 1.7–2.4)

## 2016-07-21 MED ORDER — ALBUMIN HUMAN 25 % IV SOLN
50.0000 g | INTRAVENOUS | Status: AC
  Administered 2016-07-21 (×3): 50 g via INTRAVENOUS
  Filled 2016-07-21 (×2): qty 200

## 2016-07-21 MED ORDER — FENTANYL CITRATE (PF) 100 MCG/2ML IJ SOLN
50.0000 ug | INTRAMUSCULAR | Status: DC | PRN
  Administered 2016-07-21: 50 ug via INTRAVENOUS

## 2016-07-21 MED ORDER — PROPOFOL 10 MG/ML IV BOLUS
INTRAVENOUS | Status: DC | PRN
  Administered 2016-07-21: 20 mg via INTRAVENOUS

## 2016-07-21 MED ORDER — ETOMIDATE 2 MG/ML IV SOLN
10.0000 mg | Freq: Once | INTRAVENOUS | Status: AC
  Administered 2016-07-21: 10 mg via INTRAVENOUS

## 2016-07-21 MED ORDER — MAGNESIUM SULFATE 2 GM/50ML IV SOLN: 2.0000 g | Freq: Once | INTRAVENOUS | Status: DC

## 2016-07-21 MED ORDER — FENTANYL CITRATE (PF) 100 MCG/2ML IJ SOLN: 50.0000 ug | Freq: Once | INTRAMUSCULAR | Status: AC

## 2016-07-21 MED ORDER — CHLORHEXIDINE GLUCONATE 0.12% ORAL RINSE (MEDLINE KIT)
15.0000 mL | Freq: Two times a day (BID) | OROMUCOSAL | Status: DC
  Administered 2016-07-21 – 2016-07-24 (×7): 15 mL via OROMUCOSAL

## 2016-07-21 MED ORDER — ARFORMOTEROL TARTRATE 15 MCG/2ML IN NEBU
15.0000 ug | INHALATION_SOLUTION | Freq: Two times a day (BID) | RESPIRATORY_TRACT | Status: DC
  Administered 2016-07-21 – 2016-07-25 (×9): 15 ug via RESPIRATORY_TRACT
  Filled 2016-07-21 (×11): qty 2

## 2016-07-21 MED ORDER — SODIUM CHLORIDE 0.9 % IV SOLN
10.0000 mg | Freq: Two times a day (BID) | INTRAVENOUS | Status: DC
  Administered 2016-07-21 – 2016-07-24 (×7): 10 mg via INTRAVENOUS
  Filled 2016-07-21 (×8): qty 1

## 2016-07-21 MED ORDER — IPRATROPIUM-ALBUTEROL 0.5-2.5 (3) MG/3ML IN SOLN
3.0000 mL | Freq: Four times a day (QID) | RESPIRATORY_TRACT | Status: DC
  Administered 2016-07-21 – 2016-07-24 (×13): 3 mL via RESPIRATORY_TRACT
  Filled 2016-07-21 (×12): qty 3

## 2016-07-21 MED ORDER — FENTANYL CITRATE (PF) 100 MCG/2ML IJ SOLN
50.0000 ug | INTRAMUSCULAR | Status: DC | PRN
  Filled 2016-07-21: qty 2

## 2016-07-21 MED ORDER — ROCURONIUM BROMIDE 50 MG/5ML IV SOLN
40.0000 mg | Freq: Once | INTRAVENOUS | Status: AC
  Administered 2016-07-21: 40 mg via INTRAVENOUS

## 2016-07-21 MED ORDER — BUDESONIDE 0.5 MG/2ML IN SUSP
0.5000 mg | Freq: Two times a day (BID) | RESPIRATORY_TRACT | Status: DC
  Administered 2016-07-21 – 2016-07-25 (×9): 0.5 mg via RESPIRATORY_TRACT
  Filled 2016-07-21 (×9): qty 2

## 2016-07-21 MED ORDER — ALBUMIN HUMAN 25 % IV SOLN
INTRAVENOUS | Status: AC
  Filled 2016-07-21: qty 200

## 2016-07-21 MED ORDER — SUCCINYLCHOLINE CHLORIDE 20 MG/ML IJ SOLN
INTRAMUSCULAR | Status: DC | PRN
  Administered 2016-07-21: 50 mg via INTRAVENOUS

## 2016-07-21 MED ORDER — VANCOMYCIN HCL IN DEXTROSE 1-5 GM/200ML-% IV SOLN
1000.0000 mg | Freq: Two times a day (BID) | INTRAVENOUS | Status: DC
  Administered 2016-07-21: 1000 mg via INTRAVENOUS
  Filled 2016-07-21: qty 200

## 2016-07-21 MED ORDER — FENTANYL BOLUS VIA INFUSION
25.0000 ug | INTRAVENOUS | Status: DC | PRN
  Filled 2016-07-21: qty 25

## 2016-07-21 MED ORDER — PIPERACILLIN-TAZOBACTAM 3.375 G IVPB
3.3750 g | Freq: Three times a day (TID) | INTRAVENOUS | Status: DC
  Administered 2016-07-21 – 2016-07-25 (×13): 3.375 g via INTRAVENOUS
  Filled 2016-07-21 (×25): qty 50

## 2016-07-21 MED ORDER — NOREPINEPHRINE BITARTRATE 1 MG/ML IV SOLN
0.0000 ug/min | INTRAVENOUS | Status: DC
  Administered 2016-07-21: 20 ug/min via INTRAVENOUS
  Administered 2016-07-21: 14 ug/min via INTRAVENOUS
  Filled 2016-07-21 (×2): qty 4

## 2016-07-21 MED ORDER — NOREPINEPHRINE BITARTRATE 1 MG/ML IV SOLN
0.0000 ug/min | INTRAVENOUS | Status: DC
  Administered 2016-07-21: 14 ug/min via INTRAVENOUS
  Administered 2016-07-22: 5 ug/min via INTRAVENOUS
  Filled 2016-07-21: qty 16

## 2016-07-21 MED ORDER — PROPOFOL 10 MG/ML IV BOLUS
INTRAVENOUS | Status: AC
  Filled 2016-07-21: qty 20

## 2016-07-21 MED ORDER — VANCOMYCIN HCL IN DEXTROSE 750-5 MG/150ML-% IV SOLN
750.0000 mg | INTRAVENOUS | Status: DC
  Administered 2016-07-22: 750 mg via INTRAVENOUS
  Filled 2016-07-21: qty 150

## 2016-07-21 MED ORDER — SODIUM CHLORIDE 0.9 % IV SOLN
25.0000 ug/h | INTRAVENOUS | Status: DC
  Administered 2016-07-21: 25 ug/h via INTRAVENOUS
  Filled 2016-07-21: qty 50

## 2016-07-21 MED ORDER — EPINEPHRINE PF 1 MG/10ML IJ SOSY
PREFILLED_SYRINGE | INTRAMUSCULAR | Status: DC
Start: 2016-07-21 — End: 2016-07-21
  Filled 2016-07-21: qty 10

## 2016-07-21 MED ORDER — SUCCINYLCHOLINE CHLORIDE 200 MG/10ML IV SOSY
PREFILLED_SYRINGE | INTRAVENOUS | Status: AC
  Filled 2016-07-21: qty 10

## 2016-07-21 MED ORDER — MAGNESIUM SULFATE 2 GM/50ML IV SOLN
2.0000 g | Freq: Once | INTRAVENOUS | Status: AC
  Administered 2016-07-21: 2 g via INTRAVENOUS
  Filled 2016-07-21: qty 50

## 2016-07-21 MED ORDER — ATROPINE SULFATE 1 MG/10ML IJ SOSY
PREFILLED_SYRINGE | INTRAMUSCULAR | Status: AC
  Filled 2016-07-21: qty 10

## 2016-07-21 MED ORDER — MIDAZOLAM HCL 2 MG/2ML IJ SOLN
INTRAMUSCULAR | Status: AC
  Filled 2016-07-21: qty 4

## 2016-07-21 MED ORDER — ORAL CARE MOUTH RINSE
15.0000 mL | Freq: Four times a day (QID) | OROMUCOSAL | Status: DC
  Administered 2016-07-21 – 2016-07-24 (×12): 15 mL via OROMUCOSAL

## 2016-07-21 NOTE — Progress Notes (Signed)
RN requested for pharmacy to concentrated Levophed infusion bag due to high rate of infusion.  Pharmacy changed order for Levophed 4 mg in 250 mL D5W (single strength) to Levophed 16 mg in 250 mL D5W (quadruple strength).  Pharmacy communicated need to adjust infusion pump settings for quad strength Levophed.  RN Kelly Drake) verbalized understanding.  Kelly Drake, PharmD [phone] 07/21/2016

## 2016-07-21 NOTE — Progress Notes (Signed)
CRITICAL VALUE ALERT  Critical Value: lactic acid 2.0 Date & Time Notied:  11:01 lab notified this RN, at 11:05 MD was paged, MD called back at 11:07  Provider Notified: CCM, McQuaid  Orders Received/Actions taken: continue to monitor

## 2016-07-21 NOTE — Progress Notes (Signed)
Increased RR to 20 and PEEP to 8 per MD.

## 2016-07-21 NOTE — Anesthesia Procedure Notes (Deleted)
Performed by: West Pugh

## 2016-07-21 NOTE — Progress Notes (Signed)
PHARMACY NOTE:  ANTIMICROBIAL RENAL DOSAGE ADJUSTMENT  Current antimicrobial regimen includes a mismatch between antimicrobial dosage and estimated renal function.  As per policy approved by the Pharmacy & Therapeutics and Medical Executive Committees, the antimicrobial dosage will be adjusted accordingly.  Current antimicrobial dosage:  Zosyn q6h and Vancomycin 1 Gm q12h  Indication: Sepsis  Renal Function:  Estimated Creatinine Clearance: 35 mL/min (by C-G formula based on SCr of 0.66 mg/dL). []      On intermittent HD, scheduled: []      On CRRT    Antimicrobial dosage has been changed to:  Zosyn 3.375 Gm IV q8h extended interval and Vancomycin 1 Gm x1 then 750 mg IV q24h  Additional comments: Please consult pharmacy to f/u vancomycin levels.  Thank you for allowing pharmacy to be a part of this patient's care.  Dorrene German, [facility] 07/21/2016 3:49 AM

## 2016-07-21 NOTE — Addendum Note (Signed)
Addendum  created 07/21/16 0436 by West Pugh, CRNA   Anesthesia Event edited, Anesthesia Intra Blocks edited, Anesthesia Staff edited, Delete clinical note, Order Canceled from Note

## 2016-07-21 NOTE — Progress Notes (Signed)
Pt is currently not a candidate for any surgical intervention.  Will sign off for now.  Can discuss repair of hiatal hernia in the future, once recovered from her acute illness.  Rosario Adie, MD  [facility]

## 2016-07-21 NOTE — Addendum Note (Signed)
Addendum  created 07/21/16 0432 by West Pugh, CRNA   Anesthesia Intra Blocks edited, Child order released for a procedure order, Pend clinical note, Sign clinical note

## 2016-07-21 NOTE — Progress Notes (Signed)
Increased RR to 25 per MD

## 2016-07-21 NOTE — Progress Notes (Signed)
[facility] Progress Note Patient Name: Kelly Drake DOB: 26-Nov-1930 MRN: [id number]   Date of Service  07/21/2016  HPI/Events of Note  Hypomag  eICU Interventions  Mag replaced     Intervention Category Intermediate Interventions: Electrolyte abnormality - evaluation and management  Kelly Drake 07/21/2016, 5:40 AM

## 2016-07-21 NOTE — Significant Event (Signed)
Rapid Response Event Note  Overview: Time Called: 0017 Arrival Time: 0020 Event Type: Respiratory Called by bedside RN on [facility] in regards to patient having low O2 saturations (low 80s). Told to go ahead and call respiratory therapy as well. Upon arrival, patient was desaturating in the low 80s on non-rebreather set at 10 Liters. Increased to 15 Liters and O2 saturations increased to 89-90%.  Initial Focused Assessment: Neuro: Patient's eyes open but no able to track or follow commands, responded barely to sternal rub. Cardiac: Patient in ST, S1 and S2 sounds heard upon auscultation, Pulses 1+ in all extremities. Pulmonary: Patient's lung sounds were rhonchus in all fields, breathing was labored, RR 25-30 RR/min.  Patient had been vomiting previously throughout admission, Bedside RN's were concerned for aspiration.   Interventions: STAT ABG placed STAT Chest X-ray placed Notified Walden Field NP in regards to Walden Field ordered to place maintenance fluids KVO.  Prepared for Transfer to ICU for possible intubation   Plan of Care (if not transferred):  Event Summary:   at  notified Walden Field NP at [place]

## 2016-07-21 NOTE — Anesthesia Procedure Notes (Addendum)
Procedure Name: Intubation Date/Time: 07/21/2016 1:12 AM Performed by: West Pugh Pre-anesthesia Checklist: Patient identified, Emergency Drugs available, Suction available, Patient being monitored and Timeout performed Patient Re-evaluated:Patient Re-evaluated prior to inductionOxygen Delivery Method: Ambu bag Preoxygenation: Pre-oxygenation with 100% oxygen Intubation Type: Rapid sequence Laryngoscope Size: Glidescope and 3 Grade View: Grade I Tube type: Subglottic suction tube Tube size: 7.0 mm Number of attempts: 1 Placement Confirmation: ETT inserted through vocal cords under direct vision,  positive ETCO2 and breath sounds checked- equal and bilateral Secured at: 24 cm Tube secured with: Tape Dental Injury: Teeth and Oropharynx as per pre-operative assessment  Comments: Elective use of glidescope. Patient had lots of dark brown liquid in back of pharynx that had to suctioned prior intubation. Cuff not holding pressure with tube so used tube exchanger to replace ETT. ETCO2 color change confirmation of endotracheal intubation.

## 2016-07-21 NOTE — Progress Notes (Signed)
Attempted to call pt's son to update him on pt status and POC. Unable to leave voicemail at this time due to full mailbox.

## 2016-07-21 NOTE — Consult Note (Signed)
PULMONARY / CRITICAL CARE MEDICINE   Name: Kelly Drake MRN: [id number] DOB: 11-15-1930    ADMISSION DATE:  07/25/2016 CONSULTATION DATE:  07/21/16  REFERRING MD:  Dr Darrick Meigs  HISTORY OF PRESENT ILLNESS:  40yoF with COPD, Hiatal hernia, HTN, Dementia, and B12 deficiency, admitted on 07/11/2016 with N/V, Weight loss, and Dysphagia. She was started on TPN and plan was for Barium swallow. She also developed a significant leukocytosis up to 32k on 6/15. Her hospitalization has also been complicated by delirium. Overnight she developed acute hypercapneic respiratory failure and was intubated. Following intubation she developed a right-sided tension pneumothorax and was started on vasopressors. When I first saw her this morning, I placed a right-sided pigtail chest tube (see separate procedure note). She continues to require vasopressors and high vent settings. At the time of my exam, patient is intubated and sedated and no family is present.   * PAST MEDICAL HISTORY :  She  has a past medical history of Asthma; B12 deficiency; Bladder cancer ([place]); COPD (chronic obstructive pulmonary disease) ([place]); Cystitis; Fibrocystic breast disease; GERD (gastroesophageal reflux disease); Hematuria; History of hiatal hernia; Hyperlipidemia; Hypertension; IBS (irritable bowel syndrome); and Osteoporosis.  PAST SURGICAL HISTORY: She  has a past surgical history that includes Partial hysterectomy (1979); Breast cyst excision (1964); Esophagogastroduodenoscopy (egd) with propofol (N/A, 11/04/2015); and Savory dilation (N/A, 11/04/2015).  Allergies  Allergen Reactions  . Sulfonamide Derivatives     No current facility-administered medications on file prior to encounter.    Current Outpatient Prescriptions on File Prior to Encounter  Medication Sig  . budesonide-formoterol (SYMBICORT) 80-4.5 MCG/ACT inhaler Inhale 2 puffs into the lungs 2 (two) times daily.  . Calcium Carbonate-Vitamin D (CALCIUM 600+D) 600-400 MG-UNIT  per tablet Take 2 tablets by mouth daily.   . cholecalciferol (VITAMIN D) 1000 UNITS tablet Take 1,000 Units by mouth daily.    . Cyanocobalamin (RA VITAMIN B-12 TR) 1000 MCG TBCR Take 1 tablet by mouth daily.   . DULoxetine (CYMBALTA) 20 MG capsule Take 20 mg by mouth daily.   Marland Kitchen esomeprazole (NEXIUM) 20 MG capsule Take 20 mg by mouth daily at 12 noon.   . etodolac (LODINE) 300 MG capsule Take 300 mg by mouth 2 (two) times daily.   Marland Kitchen albuterol (PROVENTIL HFA;VENTOLIN HFA) 108 (90 Base) MCG/ACT inhaler Inhale 2 puffs into the lungs every 6 (six) hours as needed for wheezing or shortness of breath. (Patient not taking: Reported on 07/17/2016)  . Spacer/Aero-Holding Chambers (AEROCHAMBER MV) inhaler Use as instructed    FAMILY HISTORY:  Her indicated that her mother is deceased. She indicated that her father is deceased. She indicated that the status of her brother is unknown.    SOCIAL HISTORY: She  reports that she quit smoking about 54 years ago. Her smoking use included Cigarettes. She has a 1.50 pack-year smoking history. She has never used smokeless tobacco. She reports that she does not drink alcohol or use drugs.  REVIEW OF SYSTEMS:   Unable to obtain due to intubated/sedated   VITAL SIGNS: BP (!) 167/85 (BP Location: Left Arm)   Pulse (!) 104   Temp 98.8 F (37.1 C) (Oral)   Resp (!) 27   Ht 4\' 11"  (1.499 m)   Wt 42.3 kg (93 lb 4.1 oz)   SpO2 91%   BMI 18.84 kg/m   HEMODYNAMICS:  Levophed @ 45mcg  VENTILATOR SETTINGS: Vent Mode: PRVC FiO2 (%):  [60 %-100 %] 100 % Set Rate:  [18 bmp-20 bmp] 20  bmp Vt Set:  [500 mL] 500 mL PEEP:  [5 cmH20-8 cmH20] 8 cmH20 Plateau Pressure:  [18 cmH20-28 cmH20] 18 cmH20  INTAKE / OUTPUT: I/O last 3 completed shifts: In: 2277.8 [P.O.:60; I.V.:1810.3; IV Piggyback:407.5] Out: 750 [Urine:750]  PHYSICAL EXAMINATION: General: Frail elderly WF, intubated and sedated, critically ill Neuro: unresponsive to voice or sternal rub; eyes open  but not tracking; PERRL. + Gag HEENT: OP clear; MM moist Cardiovascular: RRR, no m/r/g Lungs:  - Initially: wheezing on left; significantly decreased breath sounds on right - After chest tube: much improvement air movement on right; rhonchi bilaterally Abdomen: soft, NTND, BS normoactive Musculoskeletal: trace BLE edema Skin: no obvious rashes  LABS:  BMET  Recent Labs Lab 07/19/16 0441 07/20/16 0439 07/21/16 0200  NA 136 135 138  K 3.3* 4.2 4.1  CL 99* 101 113*  CO2 29 26 20*  BUN 11 17 20   CREATININE 0.69 0.76 0.66  GLUCOSE 135* 103* 169*    Electrolytes  Recent Labs Lab 07/18/16 0438 07/19/16 0441 07/20/16 0439 07/21/16 0200  CALCIUM 8.6* 8.6* 9.2 6.8*  MG 1.4* 1.9  --  1.6*  PHOS 2.8 3.4  --   --     CBC  Recent Labs Lab 07/18/16 0438 07/20/16 0439 07/20/16 1505  WBC 8.4 32.0* 25.9*  HGB 8.6* 9.0* 8.8*  HCT 25.1* 26.8* 25.8*  PLT 275 341 315    Coag's No results for input(s): APTT, INR in the last 168 hours.  Sepsis Markers No results for input(s): LATICACIDVEN, PROCALCITON, O2SATVEN in the last 168 hours.  ABG  Recent Labs Lab 07/21/16 0033 07/21/16 0314  PHART 7.109* 7.173*  PCO2ART 80.4* 60.2*  PO2ART 76.7* 73.1*    Liver Enzymes  Recent Labs Lab 07/18/16 0438 07/19/16 0441 07/20/16 0439  AST 20 19 22   ALT 15 15 18   ALKPHOS 63 66 70  BILITOT 0.3 0.5 0.5  ALBUMIN 2.3* 2.5* 2.5*    Cardiac Enzymes No results for input(s): TROPONINI, PROBNP in the last 168 hours.  Glucose  Recent Labs Lab 07/19/16 1130 07/19/16 1651 07/19/16 2347 07/20/16 0456 07/20/16 1142 07/20/16 1641  GLUCAP 135* 120* 133* 129* 134* 110*    Imaging Dg Chest Port 1 View  Result Date: 07/21/2016 CLINICAL DATA:  Status post right-sided chest tube placement. Initial encounter. EXAM: PORTABLE CHEST 1 VIEW COMPARISON:  Chest radiograph performed earlier today at 1:17 a.m. FINDINGS: There has been interval placement of a right-sided chest tube,  with tiny residual right apical pneumothorax. New prominent soft tissue air is noted about the right chest wall. The patient's endotracheal tube is seen ending 4-5 cm above the carina. A right PICC is noted ending about the mid SVC. Increasing right-sided airspace opacification may reflect atelectasis or pneumonia. Underlying vascular congestion is noted. Small bilateral pleural effusions are seen, and mild underlying interstitial edema is a concern. The cardiomediastinal silhouette is borderline normal in size. No acute osseous abnormalities are identified. External pacing pads are noted. IMPRESSION: 1. Interval placement of right-sided chest tube, with tiny residual right apical pneumothorax. New prominent soft tissue air noted about the right chest wall. 2. Endotracheal tube seen ending 4-5 cm above the carina. 3. Increasing right-sided airspace opacification may reflect atelectasis or pneumonia. 4. Underlying vascular congestion noted. Small bilateral pleural effusions noted. Mild underlying interstitial edema is a concern. Electronically Signed   By: Garald Balding M.D.   On: 07/21/2016 03:41   Portable Chest Xray  Result Date: 07/21/2016 CLINICAL DATA:  Intubation  EXAM: PORTABLE CHEST 1 VIEW COMPARISON:  07/21/2016, 07/15/2016 FINDINGS: Endotracheal tube tip is approximately 4.1 cm superior to the carina right upper extremity catheter tip overlies the SVC. Esophageal tube is coiled back upon itself over the mid mediastinum. Interim development of moderate to large right pneumothorax estimated at 50%. Small left pleural effusion and continued left basilar atelectasis or infiltrate. Stable cardiomediastinal silhouette with moderate to large hiatal hernia. IMPRESSION: 1. Endotracheal tube tip about 4.1 cm superior to the carina 2. Interim development of moderate to large right-sided pneumothorax. There is very slight shift of mediastinal contents to the left. 3. Small left pleural effusion with left basilar  atelectasis or infiltrate. 4. Esophageal tube is folded back upon itself over the mid mediastinum 5. Large hiatal hernia Critical Value/emergent results were called by telephone at the time of interpretation on 07/21/2016 at 2:11 am to Dr. Benjamine Mola DETERDING , who verbally acknowledged these results. Electronically Signed   By: Donavan Foil M.D.   On: 07/21/2016 02:11   Dg Chest Port 1 View  Result Date: 07/21/2016 CLINICAL DATA:  Acute respiratory failure EXAM: PORTABLE CHEST 1 VIEW COMPARISON:  07/15/2016 FINDINGS: A right upper extremity catheter tip overlies the SVC. Worsening consolidation in the left lung base with probable small left effusion. Minimal improved aeration of the right base with residual hazy pulmonary opacity and small right effusion. Stable cardiomediastinal silhouette. No pneumothorax. Hiatal hernia IMPRESSION: 1. Interval worsening of left lung base atelectasis or pneumonia with small left pleural effusion now present 2. Continued right pleural effusion with hazy infiltrate in the right lung base with minimal improved aeration at the right base. Electronically Signed   By: Donavan Foil M.D.   On: 07/21/2016 01:16   Dg Abd Portable 1v  Result Date: 07/21/2016 CLINICAL DATA:  Feeding tube placement EXAM: PORTABLE ABDOMEN - 1 VIEW COMPARISON:  07/19/2016 FINDINGS: Enteric tube is looped back upon itself over the mid mediastinum. Partially visualized large pneumothorax at the right lung base. IMPRESSION: 1. Enteral tube is looped back upon itself over the mid mediastinum. 2. Large right basilar pneumothorax. Critical Value/emergent results were called by telephone at the time of interpretation on 07/21/2016 at 2:11 am to Dr. Benjamine Mola DETERDING , who verbally acknowledged these results. Electronically Signed   By: Donavan Foil M.D.   On: 07/21/2016 02:11   LINES/TUBES: RUE PICC line Foley catheter (placed 6/16 AM) ETT (placed 6/16 AM)  ASSESSMENT / PLAN: 66yoF with COPD, Hiatal  hernia, HTN, Dementia, and B12 deficiency, admitted on 07/08/2016 with N/V, Weight loss, and Dysphagia, now transferred to ICU with acute hypercapneic and acute hypoxic respiratory failure requiring intubation and right-sided tension pneumothorax now s/p chest tube, continued to have shock requiring vasopressors and severe hypoxia requiring high vent settings.   PULMONARY 1. Acute hypercapneic and Acute hypoxic respiratory failure: due to left-sided tension pneumothorax; also suspicion for possible aspiration pneumonia - right-sided pigtail catheter placed (see separate procedure note) and is to suction. - hypercapnea is mildly improved (pCO2 from 80 to 61) immediately after chest tube placement. Increased RR from 18 to 20; Vt already at 500. Will recheck another ABG in 1 hour - continues to have severe hypoxia even after chest tube placement, currently on 100% FIO2 and 8 PEEP.  - CXR post-chest tube on my review shows chest tube in good position with lung reinflated; ETT in good position. Has significant subcutaneous emphysema. Has RLL infiltrate that may be consistent with an aspiration pneumonia - obtain sputum culture; start  Vanc and Zosyn - continue GI and DVT prophylaxis - hold tube feeds for now; re-eval in AM - place foley catheter - fentanyl gtt for sedation  2. COPD: - stop dulera; start pulmicort and brovana nebs BID - start duonebs q6 hrs scheduled  CARDIOVASCULAR 1. Shock: initially shock thought to be due only to the tension pneumothorax, but now continues to be in shock even after pneumothorax is re-expanded. She has SIRS and her CXR shows a RLL infiltrate, so this is consistent with septic shock due to an aspiration pneumonia. - currently on levophed @ 42mcg; has received only 500cc NS thus far. For her weight, she needs an additional 750cc to equal the 30cc/kg recommended for septic shock.  - will check procalcitonin; trend lactates - start Vanc and Zosyn  GASTROINTESTINAL 1.  Hiatal hernia; Dysphagia: - defer workup to GI consult  HEMATOLOGIC 1. Leukocytosis: thought due to sepsis; will continue to follow  2. Hypomagnesemia:  - replete with 2g Mag IV  INFECTIOUS 1. Septic shock as stated above; see plan above  NEUROLOGIC 1. Delirium: likely related to her acute illness in setting of history of dementia - continue to monitor; minimize benzo use - RASS goal: -1   60 minutes nonprocedural critical care time spent with this patient   Vernie Murders, MD Pulmonary and [place] Pager: [phone]  07/21/2016, 3:44 AM

## 2016-07-21 NOTE — Progress Notes (Signed)
Attempted to call son 5+ times to alert of pt transfer and critical status. Not answering phone and voicemail box is full, not receiving messages.

## 2016-07-21 NOTE — Progress Notes (Signed)
[facility]  Seen overnight by Dr. Jimmey Ralph; Admitted on 07/15/2016 with nausea, vomiting with baseline COPD and dementia.  Has been receiving TPN and apparently there were plans for surgical correction of her hiatal hernia.  Her family notes increasing work of breathing occurred last night. Around 3AM she required intubation and had a tension pneumothorax afterwards.  Emergent chest tube placed by Dr. Jimmey Ralph.  She remains in shock this morning.  On exam Vitals:   07/21/16 0530 07/21/16 0630 07/21/16 0645 07/21/16 0700  BP: (!) 107/52 (!) 108/54 (!) 112/51 (!) 104/54  Pulse: 70 70 80 68  Resp: 20 (!) 23 (!) 23 (!) 22  Temp:      TempSrc:      SpO2: 99% 92% 92% 94%  Weight:      Height:       Vent Mode: PRVC FiO2 (%):  [50 %-100 %] 50 % Set Rate:  [18 bmp-25 bmp] 25 bmp Vt Set:  [500 mL] 500 mL PEEP:  [5 cmH20-8 cmH20] 8 cmH20 Plateau Pressure:  [14 cmH20-28 cmH20] 14 cmH20  Gen: chronically ill appearing, on vent HENT: NCAT ETT in place PULM: rhonchi bilaterally, vent supported breathing CV: RRR, no mgr GI: scaphoid abdomen, minimal bowel sounds MSK: diminished muscular bulk Neuro: awake, not following commands  CXR images independently reviewed: emphysematous lungs with bilateral infiltrates, pigtail chest catheter in place  Pneumothorax> still has air leak 6/16, continue chest tube to suction Acute on chronic respiratory failure with hypoxemia> now with cuff leak on endotracheal tube; will change ETT now, continue full vent support Aspiration pneumonia> continue vanc/zosyn for now, f/u cultures Circulatory shock> presumably septic shock given bilateral infiltrates, ddx includes cardiogenic; check SvO2, follow lactic acid, bolus another 500cc now, continue levophed AKI> (Cr increase by 50%) continue IVF   Prognosis is dismal.  I called her son to let him know that she probably won't survive this but we will try our best by supporting her with vasopressors, fluids, antibiotics,  mechanical ventilation.  If she gets worse we should make her comfortable.    Her code status is DNR, no chest compressions, vasopressors and mechanical ventilation for now  Additional CC time by me 40 minutes  Roselie Awkward, MD [facility] Pager: [phone] Cell: [phone] After 3pm or if no response, call [phone]

## 2016-07-21 NOTE — Progress Notes (Signed)
Unable to place OG tube 2x.

## 2016-07-21 NOTE — Progress Notes (Signed)
Patient with history of COPD and severe hiatal hernia, plans of surgery on 6/19 - admitted 07/31/2016 with  Dysphagia, intractable nausea and weight loss. Patient had respiratory decompensation with oxygen saturation dropping into 60s. Placed on NRB, ABG and CXR obtained.  Patient non-responsive verbally at this time and per nursing there has been concern regarding aspiration.  Physical Exam Vitals:   07/20/16 2039 07/21/16 0034  BP: (!) 154/61 (!) 167/85  Pulse: 86 (!) 104  Resp: (!) 22 (!) 27  Temp: 98.8 F (37.1 C)    Oxygen sat 85% on NRB General:  Vacant stare, non-responsive Heart: Tachycardia - ST with PVcs Lungs: scattered rhonchi and rales Ext: cool, +1 pedal pulses, no edema  ABG: Ph  7.103 pCO2 82.3 PO2 79.3 O2 sat  88%  IMPRESSION AND PLAN  Acute respiratory failure Transferred to ICU. CCM consulted and patient  intubated.

## 2016-07-21 NOTE — Procedures (Signed)
Chest Tube Insertion Procedure Note  Indications:  Tension Pneumothorax with resultant Shock  Pre-operative Diagnosis: Left-sided tension pneumothorax  Post-operative Diagnosis: {same  Procedure Details  Procedure performed emergently as patient was hemodynamically unstable. Timeout performed. Procedure performed at bedside in ICU under full sterile conditions. Chloroprep applied x 3 and allowed to dry completely. 5cc of 1% Lidocaine injected locally for anesthesia. Pigtail catheter placed in right anterior clavicular line at 4th intercostal space. A gush of air was immediately felt. Catheter attached to suction and good tidaling observed within the tube. Blood pressure then began to stabilize. Chest tube sutured into place and sterile dressing applied. Estimated blood loss: 2cc. No complications. Postprocedure CXR pending.

## 2016-07-21 NOTE — Procedures (Signed)
Intubation Procedure Note Kelly Drake [id number] February 22, 1930  Procedure: Intubation Indications: ETT needed to be changed  Procedure Details Consent: Risks of procedure as well as the alternatives and risks of each were explained to the (patient/caregiver).  Consent for procedure obtained. Time Out: Verified patient identification, verified procedure, site/side was marked, verified correct patient position, special equipment/implants available, medications/allergies/relevent history reviewed, required imaging and test results available.  Performed  Drugs Etomidate 71m, Rocuronium 420mIV, fentanyl infusion DL x 1 with Mac 3 blade Grade 1 view of beefy/swollen vocal cords Tube exchanger placed at 35cm 7.5 ET tube passed through cords under direct visualization (ETT placed using tube exchanger) Placement confirmed with bilateral breath sounds, positive EtCO2 change and smoke in tube   Evaluation Hemodynamic Status: BP stable throughout; O2 sats: stable throughout Patient's Current Condition: stable Complications: No apparent complications Patient did tolerate procedure well. Chest X-ray ordered to verify placement.  CXR: pending.   DoSimonne Maffucci/16/2018

## 2016-07-22 ENCOUNTER — Inpatient Hospital Stay (HOSPITAL_COMMUNITY): Payer: PPO

## 2016-07-22 LAB — MAGNESIUM
Magnesium: 2.3 mg/dL (ref 1.7–2.4)
Magnesium: 2.4 mg/dL (ref 1.7–2.4)

## 2016-07-22 LAB — GLUCOSE, CAPILLARY
GLUCOSE-CAPILLARY: 88 mg/dL (ref 65–99)
GLUCOSE-CAPILLARY: 90 mg/dL (ref 65–99)
GLUCOSE-CAPILLARY: 99 mg/dL (ref 65–99)
Glucose-Capillary: 100 mg/dL — ABNORMAL HIGH (ref 65–99)
Glucose-Capillary: 76 mg/dL (ref 65–99)
Glucose-Capillary: 73 mg/dL (ref 65–99)
Glucose-Capillary: 118 mg/dL — ABNORMAL HIGH (ref 65–99)

## 2016-07-22 LAB — CREATININE, SERUM
CREATININE: 1.09 mg/dL — AB (ref 0.44–1.00)
GFR calc non Af Amer: 45 mL/min — ABNORMAL LOW (ref >60)
GFR, EST AFRICAN AMERICAN: 52 mL/min — AB (ref >60)

## 2016-07-22 LAB — PHOSPHORUS
PHOSPHORUS: 3.2 mg/dL (ref 2.5–4.6)
PHOSPHORUS: 3 mg/dL (ref 2.5–4.6)

## 2016-07-22 LAB — PROCALCITONIN: PROCALCITONIN: 14.82 ng/mL

## 2016-07-22 MED ORDER — VITAL 1.5 CAL PO LIQD
1000.0000 mL | ORAL | Status: DC
  Administered 2016-07-22: 1000 mL
  Filled 2016-07-22 (×3): qty 1000

## 2016-07-22 MED ORDER — PRO-STAT SUGAR FREE PO LIQD: 30.0000 mL | Freq: Two times a day (BID) | ORAL | Status: DC

## 2016-07-22 MED ORDER — POLYVINYL ALCOHOL 1.4 % OP SOLN
1.0000 [drp] | OPHTHALMIC | Status: DC | PRN
  Administered 2016-07-22: 1 [drp] via OPHTHALMIC
  Filled 2016-07-22: qty 15

## 2016-07-22 MED ORDER — DEXTROSE 10 % IV SOLN
INTRAVENOUS | Status: DC
  Administered 2016-07-22 – 2016-07-24 (×3): via INTRAVENOUS

## 2016-07-22 MED ORDER — VITAL HIGH PROTEIN PO LIQD
1000.0000 mL | ORAL | Status: DC
  Filled 2016-07-22: qty 1000

## 2016-07-22 MED ORDER — FENTANYL CITRATE (PF) 100 MCG/2ML IJ SOLN
50.0000 ug | INTRAMUSCULAR | Status: DC | PRN
  Filled 2016-07-22: qty 2

## 2016-07-22 MED ORDER — FENTANYL CITRATE (PF) 100 MCG/2ML IJ SOLN
50.0000 ug | INTRAMUSCULAR | Status: DC | PRN
  Administered 2016-07-22 (×2): 50 ug via INTRAVENOUS

## 2016-07-22 MED ORDER — DEXTROSE 10 % IV SOLN
INTRAVENOUS | Status: DC
Start: 2016-07-22 — End: 2016-07-22

## 2016-07-22 NOTE — Progress Notes (Signed)
Nutrition Follow-up  DOCUMENTATION CODES:   Underweight, Severe malnutrition in context of acute illness/injury  INTERVENTION:   Monitor magnesium, potassium, and phosphorus daily for at least 3 days, MD to replete as needed, as pt is at risk for refeeding syndrome given intolerance to PO intakes for over a week.   Initiate Vital 1.5 @ 20 ml/hr and advance by 10 ml every 4 hours to goal rate of 40 ml/hr. Provides 1440 kcal (100% of needs), 62g protein and 733 ml H2O.  RD to continue to monitor for plan  NUTRITION DIAGNOSIS:   Malnutrition(severe) related to acute illness, nausea, vomiting as evidenced by percent weight loss, severe depletion of body fat, severe depletion of muscle mass.  Ongoing.  GOAL:   Patient will meet greater than or equal to 90% of their needs  Not meeting.  MONITOR:   Vent status, Labs, Weight trends, TF tolerance, I & O's, GOC  REASON FOR ASSESSMENT:   Consult, Ventilator Enteral/tube feeding initiation and management  ASSESSMENT:   83 y.o. female with medical history significant for emphysema, B12 deficiency, hiatal hernia, and GERD, now presenting to the emergency department at the direction of her primary care physician for evaluation of dysphagia to solids with nausea, vomiting, and progressive weight loss.  TPN has been stopped over the weekend. Pt now intubated and to start TF. Recommend slower advancement than PEPUP as pt did not receive TPN at goal rate before d/c and was not tolerating PO intake well. Pt was having episodes of N/V throughout admission.   Patient is currently intubated on ventilator support for pneumothorax MV: 12.5 L/min Temp (24hrs), Avg:99.3 F (37.4 C), Min:97.5 F (36.4 C), Max:102.2 F (39 C)  Medications reviewed. Labs reviewed: GFR: 45  Diet Order:  Diet NPO time specified  Skin:  Reviewed, no issues  Last BM:  6/16  Height:   Ht Readings from Last 1 Encounters:  07/21/16 4\' 11"  (1.499 m)     Weight:   Wt Readings from Last 1 Encounters:  07/22/16 91 lb 7.9 oz (41.5 kg)    Ideal Body Weight:  44.7 kg  BMI:  Body mass index is 18.48 kg/m.  Estimated Nutritional Needs:   Kcal:  1434  Protein:  55-65g  Fluid:  1.4L/day  EDUCATION NEEDS:   No education needs identified at this time  Clayton Bibles, MS, RD, LDN Pager: [phone] After Hours Pager: [phone]

## 2016-07-22 NOTE — Clinical Social Work Note (Signed)
Patient's chart reviewed. Patient now intubated.  CSW services will continue to monitor and support as needed. Pt originally seen for SNF placement. Pt also on TPN and Levaphed.  Lorie Phenix. Pauline Good, [place]

## 2016-07-22 NOTE — Progress Notes (Addendum)
PULMONARY / CRITICAL CARE MEDICINE   Name: Kelly Drake MRN: [id number] DOB: 1930/09/06    ADMISSION DATE:  07/12/2016 CONSULTATION DATE:  07/21/2016  REFERRING MD:  Hammonds  CHIEF COMPLAINT:  Respiratory failure  HISTORY OF PRESENT ILLNESS:   83 y/o female with a hiatal hernia and COPD who was hospitalized on 6/8 for malnutrition, failure to thrive and pneumonia in the setting of a large hiatal hernia who on 6/16 developed acute respiratory failure with hypoxemia necessitating intubation.  After intubation she was noted to have a R pneumothorax so PCCM placed a chest tube.    SUBJECTIVE:  Pressor requirement down Didn't breathe on SBT/WUA this morning Fever overnight  VITAL SIGNS: BP (!) 124/52   Pulse 80   Temp 97.6 F (36.4 C) (Axillary)   Resp (!) 25   Ht 4\' 11"  (1.499 m)   Wt 91 lb 7.9 oz (41.5 kg)   SpO2 100%   BMI 18.48 kg/m   HEMODYNAMICS:    VENTILATOR SETTINGS: Vent Mode: PRVC FiO2 (%):  [40 %] 40 % Set Rate:  [25 bmp] 25 bmp Vt Set:  [500 mL] 500 mL PEEP:  [5 cmH20-8 cmH20] 8 cmH20 Plateau Pressure:  [24 cmH20-26 cmH20] 25 cmH20  INTAKE / OUTPUT: I/O last 3 completed shifts: In: 2392.4 [I.V.:1764.9; IV Piggyback:627.5] Out: 1270 [Urine:700; Chest Tube:570]  PHYSICAL EXAMINATION: General:  Resting comfortably in bed HENT: NCAT ETT in place PULM: CTA B, normal effort CV: RRR, no mgr GI: BS+, soft, nontender MSK: normal bulk and tone Neuro: sleepy, minimal response to external stimuli   LABS:  BMET  Recent Labs Lab 07/20/16 0439 07/21/16 0200 07/21/16 0500 07/22/16 0729  NA 135 138 135  --   K 4.2 4.1 4.9  --   CL 101 113* 104  --   CO2 26 20* 23  --   BUN 17 20 27*  --   CREATININE 0.76 0.66 0.93 1.09*  GLUCOSE 103* 169* 121*  --     Electrolytes  Recent Labs Lab 07/18/16 0438 07/19/16 0441 07/20/16 0439 07/21/16 0200 07/21/16 0500  CALCIUM 8.6* 8.6* 9.2 6.8* 8.7*  MG 1.4* 1.9  --  1.6*  --   PHOS 2.8 3.4  --   --   --      CBC  Recent Labs Lab 07/20/16 0439 07/20/16 1505 07/21/16 0503  WBC 32.0* 25.9* 10.8*  HGB 9.0* 8.8* 9.3*  HCT 26.8* 25.8* 29.0*  PLT 341 315 366    Coag's No results for input(s): APTT, INR in the last 168 hours.  Sepsis Markers  Recent Labs Lab 07/21/16 0503 07/21/16 1023 07/22/16 0729  LATICACIDVEN 1.5 2.0*  --   PROCALCITON 4.19  --  14.82    ABG  Recent Labs Lab 07/21/16 0033 07/21/16 0314 07/21/16 0516  PHART 7.109* 7.173* 7.194*  PCO2ART 80.4* 60.2* 58.7*  PO2ART 76.7* 73.1* 112*    Liver Enzymes  Recent Labs Lab 07/19/16 0441 07/20/16 0439 07/21/16 0503  AST 19 22 27   ALT 15 18 20   ALKPHOS 66 70 85  BILITOT 0.5 0.5 0.3  ALBUMIN 2.5* 2.5* 2.2*    Cardiac Enzymes No results for input(s): TROPONINI, PROBNP in the last 168 hours.  Glucose  Recent Labs Lab 07/21/16 1300 07/21/16 1625 07/21/16 1952 07/22/16 0015 07/22/16 0504 07/22/16 0727  GLUCAP 115* 79 83 88 100* 76    Imaging No results found.   STUDIES:  6/10 CT head> chronic white matter changes, no acute intracranial abnormality  CULTURES: 6/16 blood > 6/16 resp > GPR, GPC pairs, Rare budding yeast  ANTIBIOTICS: 6/16 zosyn >  6/16 vanc > 6/17  SIGNIFICANT EVENTS: 6/8 admission 6/16 intubation for respiratory failure/pneumothorax  LINES/TUBES: 6/12 PICC >  6/16 ETT >  6/16 R pigtail pleural drainage catheter >    DISCUSSION: 82 y/o female with multi-orgain failure, septic shock, acute respiratory failure with hypoxemia due to aspiration pneumonia.    ASSESSMENT / PLAN:  PULMONARY A: Acute respiratory failure with hypoxemia ASpiration pneumonia R pneumothorax, iatrogenic COPD/emphysema P:   Continue current bronchodilators Full vent support Daily WUA/SBT VAP prevention See ID Continue chest tube to 20cm suction > pigtail catheter orderset placed (flush with saline to keep patent, etc)  CARDIOVASCULAR A:  Septic shock> levophed requirements  improving P:  Tele Wean off levophed for MAP > 65  RENAL A:   AKI > worsening, due to shock P:   Monitor BMET and UOP Replace electrolytes as needed   GASTROINTESTINAL A:   Hiatal hernia Protein calorie malnutrition P:   Place small bore gastric tube Start tube feedings  HEMATOLOGIC A:   No acute issues P:  Monitor for bleeding  INFECTIOUS A:   Aspiration pneumonia P:   Stop vanc F/u cultures Continue zosyn  ENDOCRINE A:   No acute issues  P:   Monitor glucose  NEUROLOGIC A:   Acute encephalopathy> oversedated 6/17 AM P:   RASS goal: 0-1 Stop fentanyl drip Use fentanyl prn only   FAMILY  - Updates: son updated extensively on 6/17; we elected to not escalate care.  As of today she has seen a slight improvement in her shock but her renal function is worse.  I explained that given her frail state it is very unlikely she will come through this with a good quality of life.  They voiced understanding.  We will keep supporting her for the next 24-48 hours but if she is not improving by Tuesday then we need to withdraw care.  He agrees.  - Inter-disciplinary family meet or Palliative Care meeting due by:  day 7  My cc time 40 minutes  Roselie Awkward, MD [facility] Pager: [phone] Cell: [phone] After 3pm or if no response, call [phone]   07/22/2016, 10:24 AM

## 2016-07-23 ENCOUNTER — Inpatient Hospital Stay (HOSPITAL_COMMUNITY): Payer: PPO

## 2016-07-23 DIAGNOSIS — T17908A Unspecified foreign body in respiratory tract, part unspecified causing other injury, initial encounter: Secondary | ICD-10-CM

## 2016-07-23 DIAGNOSIS — J9621 Acute and chronic respiratory failure with hypoxia: Secondary | ICD-10-CM

## 2016-07-23 DIAGNOSIS — J439 Emphysema, unspecified: Secondary | ICD-10-CM

## 2016-07-23 LAB — PHOSPHORUS
Phosphorus: 2 mg/dL — ABNORMAL LOW (ref 2.5–4.6)
Phosphorus: 2.4 mg/dL — ABNORMAL LOW (ref 2.5–4.6)

## 2016-07-23 LAB — CBC WITH DIFFERENTIAL/PLATELET
BASOS ABS: 0 10*3/uL (ref 0.0–0.1)
BASOS PCT: 0 %
Basophils Absolute: 0 10*3/uL (ref 0.0–0.1)
Basophils Relative: 0 %
Eosinophils Absolute: 0.4 10*3/uL (ref 0.0–0.7)
Eosinophils Absolute: 0.3 10*3/uL (ref 0.0–0.7)
Eosinophils Relative: 2 %
Eosinophils Relative: 2 %
HEMATOCRIT: 18 % — AB (ref 36.0–46.0)
HEMATOCRIT: 18.5 % — AB (ref 36.0–46.0)
HEMOGLOBIN: 6.3 g/dL — AB (ref 12.0–15.0)
HEMOGLOBIN: 6.5 g/dL — AB (ref 12.0–15.0)
LYMPHS ABS: 0.7 10*3/uL (ref 0.7–4.0)
LYMPHS PCT: 5 %
LYMPHS PCT: 4 %
Lymphs Abs: 0.7 10*3/uL (ref 0.7–4.0)
MCH: 29.3 pg (ref 26.0–34.0)
MCH: 29 pg (ref 26.0–34.0)
MCHC: 35 g/dL (ref 30.0–36.0)
MCHC: 35.1 g/dL (ref 30.0–36.0)
MCV: 83.7 fL (ref 78.0–100.0)
MCV: 82.6 fL (ref 78.0–100.0)
MONO ABS: 0.8 10*3/uL (ref 0.1–1.0)
MONOS PCT: 6 %
Monocytes Absolute: 0.9 10*3/uL (ref 0.1–1.0)
Monocytes Relative: 6 %
NEUTROS ABS: 12.8 10*3/uL — AB (ref 1.7–7.7)
NEUTROS ABS: 13.6 10*3/uL — AB (ref 1.7–7.7)
NEUTROS PCT: 87 %
NEUTROS PCT: 88 %
Platelets: 224 10*3/uL (ref 150–400)
Platelets: 230 10*3/uL (ref 150–400)
RBC: 2.15 MIL/uL — ABNORMAL LOW (ref 3.87–5.11)
RBC: 2.24 MIL/uL — AB (ref 3.87–5.11)
RDW: 14.4 % (ref 11.5–15.5)
RDW: 14.4 % (ref 11.5–15.5)
WBC: 14.6 10*3/uL — ABNORMAL HIGH (ref 4.0–10.5)
WBC: 15.5 10*3/uL — AB (ref 4.0–10.5)

## 2016-07-23 LAB — COMPREHENSIVE METABOLIC PANEL
ALBUMIN: 2.7 g/dL — AB (ref 3.5–5.0)
ALK PHOS: 64 U/L (ref 38–126)
ALT: 13 U/L — ABNORMAL LOW (ref 14–54)
AST: 21 U/L (ref 15–41)
Anion gap: 9 (ref 5–15)
BILIRUBIN TOTAL: 0.8 mg/dL (ref 0.3–1.2)
BUN: 23 mg/dL — AB (ref 6–20)
CALCIUM: 8.3 mg/dL — AB (ref 8.9–10.3)
CO2: 23 mmol/L (ref 22–32)
CREATININE: 0.8 mg/dL (ref 0.44–1.00)
Chloride: 104 mmol/L (ref 101–111)
GFR calc Af Amer: >60 mL/min (ref >60)
Glucose, Bld: 138 mg/dL — ABNORMAL HIGH (ref 65–99)
Potassium: 3.2 mmol/L — ABNORMAL LOW (ref 3.5–5.1)
Sodium: 136 mmol/L (ref 135–145)
TOTAL PROTEIN: 4.9 g/dL — AB (ref 6.5–8.1)

## 2016-07-23 LAB — GLUCOSE, CAPILLARY
GLUCOSE-CAPILLARY: 119 mg/dL — AB (ref 65–99)
Glucose-Capillary: 134 mg/dL — ABNORMAL HIGH (ref 65–99)
Glucose-Capillary: 127 mg/dL — ABNORMAL HIGH (ref 65–99)
Glucose-Capillary: 142 mg/dL — ABNORMAL HIGH (ref 65–99)
Glucose-Capillary: 152 mg/dL — ABNORMAL HIGH (ref 65–99)
Glucose-Capillary: 119 mg/dL — ABNORMAL HIGH (ref 65–99)
Glucose-Capillary: 125 mg/dL — ABNORMAL HIGH (ref 65–99)

## 2016-07-23 LAB — CULTURE, RESPIRATORY W GRAM STAIN: Culture: NORMAL

## 2016-07-23 LAB — PREPARE RBC (CROSSMATCH)

## 2016-07-23 LAB — MAGNESIUM
Magnesium: 2.1 mg/dL (ref 1.7–2.4)
Magnesium: 2 mg/dL (ref 1.7–2.4)

## 2016-07-23 LAB — PROCALCITONIN: Procalcitonin: 8.58 ng/mL

## 2016-07-23 LAB — CULTURE, RESPIRATORY

## 2016-07-23 LAB — ABO/RH: ABO/RH(D): B POS

## 2016-07-23 LAB — PREALBUMIN: Prealbumin: <5 mg/dL — ABNORMAL LOW (ref 18–38)

## 2016-07-23 LAB — TRIGLYCERIDES: Triglycerides: 59 mg/dL

## 2016-07-23 LAB — BILIRUBIN, DIRECT: BILIRUBIN DIRECT: 0.3 mg/dL (ref 0.1–0.5)

## 2016-07-23 MED ORDER — POTASSIUM CHLORIDE 20 MEQ/15ML (10%) PO SOLN
40.0000 meq | ORAL | Status: AC
  Administered 2016-07-23 (×2): 40 meq
  Filled 2016-07-23 (×2): qty 30

## 2016-07-23 MED ORDER — OSMOLITE 1.5 CAL PO LIQD: 1000.0000 mL | ORAL | Status: DC

## 2016-07-23 MED ORDER — OSMOLITE 1.2 CAL PO LIQD
1000.0000 mL | ORAL | Status: DC
  Administered 2016-07-23 – 2016-07-25 (×3): 1000 mL
  Filled 2016-07-23: qty 1000

## 2016-07-23 MED ORDER — POTASSIUM PHOSPHATES 15 MMOLE/5ML IV SOLN
20.0000 meq | Freq: Once | INTRAVENOUS | Status: AC
  Administered 2016-07-23: 20 meq via INTRAVENOUS
  Filled 2016-07-23: qty 4.55

## 2016-07-23 MED ORDER — FENTANYL CITRATE (PF) 100 MCG/2ML IJ SOLN: 12.5000 ug | INTRAMUSCULAR | Status: DC | PRN

## 2016-07-23 MED ORDER — SODIUM CHLORIDE 0.9 % IV SOLN
Freq: Once | INTRAVENOUS | Status: AC
  Administered 2016-07-23: 15:00:00 via INTRAVENOUS

## 2016-07-23 NOTE — Progress Notes (Signed)
PT Cancellation Note  Patient Details Name: Kelly Drake MRN: [id number] DOB: Jan 23, 1931   Cancelled Treatment:    Reason Eval/Treat Not Completed: Medical issues which prohibited therapy. Pt currently on vent. Will hold PT for now.    Weston Anna, MPT Pager: [phone]

## 2016-07-23 NOTE — Care Management Note (Signed)
Case Management Note  Patient Details  Name: KRITI KATAYAMA MRN: [id number] Date of Birth: February 09, 1930  Subjective/Objective:                  83 y/o female with a hiatal hernia and COPD who was hospitalized on 6/8 for malnutrition, failure to thrive and pneumonia in the setting of a large hiatal hernia who on 6/16 developed acute respiratory failure with hypoxemia necessitating intubation.  After intubation she was noted to have a R pneumothorax so PCCM placed a chest tube.    SUBJECTIVE:  Pressor requirement down Didn't breathe on SBT/WUA this morning Fever overnight  Action/Plan: Date:  July 23, 2016 currently lives alone. Chart reviewed for concurrent status and case management needs.  Will continue to follow patient progress.  Discharge Planning: following for needs possible snf placement Expected discharge date: [id number]  Velva Harman, BSN, [place], [place]   Expected Discharge Date:                  Expected Discharge Plan:     In-House Referral:  Clinical Social Work  Discharge planning Services  CM Consult  Post Acute Care Choice:    Choice offered to:     DME Arranged:    DME Agency:     HH Arranged:    [facility] Agency:     Status of Service:  In process, will continue to follow  If discussed at Long Length of Stay Meetings, dates discussed:    Additional Comments:  Leeroy Cha, RN 07/23/2016, 9:15 AM

## 2016-07-23 NOTE — Progress Notes (Signed)
PULMONARY / CRITICAL CARE MEDICINE   Name: Kelly Drake MRN: [id number] DOB: 09/01/1930    ADMISSION DATE:  07/14/2016 CONSULTATION DATE:  07/21/2016  REFERRING MD:  Hammonds  CHIEF COMPLAINT:  Respiratory failure  HISTORY OF PRESENT ILLNESS:   83 y/o female with a hiatal hernia and COPD who was hospitalized on 6/8 for malnutrition, failure to thrive and pneumonia in the setting of a large hiatal hernia who on 6/16 developed acute respiratory failure with hypoxemia necessitating intubation.  After intubation she was noted to have a R pneumothorax so PCCM placed a chest tube.    SUBJECTIVE:  ->passing SBT ->awake.  VITAL SIGNS: BP (!) 106/48   Pulse 84   Temp 99.6 F (37.6 C) (Rectal)   Resp 17   Ht 4\' 11"  (1.499 m)   Wt 96 lb 5.5 oz (43.7 kg)   SpO2 100%   BMI 19.46 kg/m   HEMODYNAMICS:    VENTILATOR SETTINGS: Vent Mode: PSV FiO2 (%):  [40 %] 40 % Set Rate:  [25 bmp] 25 bmp Vt Set:  [500 mL] 500 mL PEEP:  [8 cmH20] 8 cmH20 Pressure Support:  [8 cmH20] 8 cmH20 Plateau Pressure:  [21 cmH20-25 cmH20] 25 cmH20  INTAKE / OUTPUT:  Intake/Output Summary (Last 24 hours) at 07/23/16 1005 Last data filed at 07/23/16 0900  Gross per 24 hour  Intake          1396.29 ml  Output              650 ml  Net           746.29 ml   PHYSICAL EXAMINATION: General appearance:  Very frail 83 Year old  female,cachectic  NAD, Eyes: anicteric sclerae, moist conjunctivae; PERRL, EOMI bilaterally. Mouth:  membranes and no mucosal ulcerations; orally intubated Neck: Trachea midline; neck supple, no JVD Lungs/chest: rhonchi on rhigh with normal respiratory effort and no intercostal retractions, no airleak noted on right CT  CV: RRR, no MRGs  Abdomen: Soft, non-tender; no masses or HSM Extremities: No peripheral edema or extremity lymphadenopathy Skin: Normal temperature, turgor and texture; no rash, ulcers or subcutaneous nodules Neuro/ Psych: eyes open. Follows commands. Generalized  weakness.  LABS: BMET  Recent Labs Lab 07/20/16 0439 07/21/16 0200 07/21/16 0500 07/22/16 0729  NA 135 138 135  --   K 4.2 4.1 4.9  --   CL 101 113* 104  --   CO2 26 20* 23  --   BUN 17 20 27*  --   CREATININE 0.76 0.66 0.93 1.09*  GLUCOSE 103* 169* 121*  --    Electrolytes  Recent Labs Lab 07/19/16 0441 07/20/16 0439 07/21/16 0200 07/21/16 0500 07/22/16 1124 07/22/16 1845  CALCIUM 8.6* 9.2 6.8* 8.7*  --   --   MG 1.9  --  1.6*  --  2.3 2.4  PHOS 3.4  --   --   --  3.2 3.0   CBC  Recent Labs Lab 07/20/16 0439 07/20/16 1505 07/21/16 0503  WBC 32.0* 25.9* 10.8*  HGB 9.0* 8.8* 9.3*  HCT 26.8* 25.8* 29.0*  PLT 341 315 366   Coag's No results for input(s): APTT, INR in the last 168 hours.  Sepsis Markers  Recent Labs Lab 07/21/16 0503 07/21/16 1023 07/22/16 0729  LATICACIDVEN 1.5 2.0*  --   PROCALCITON 4.19  --  14.82    ABG  Recent Labs Lab 07/21/16 0033 07/21/16 0314 07/21/16 0516  PHART 7.109* 7.173* 7.194*  PCO2ART 80.4* 60.2* 58.7*  PO2ART 76.7* 73.1* 112*    Liver Enzymes  Recent Labs Lab 07/19/16 0441 07/20/16 0439 07/21/16 0503  AST 19 22 27   ALT 15 18 20   ALKPHOS 66 70 85  BILITOT 0.5 0.5 0.3  ALBUMIN 2.5* 2.5* 2.2*    Cardiac Enzymes No results for input(s): TROPONINI, PROBNP in the last 168 hours.  Glucose  Recent Labs Lab 07/22/16 1633 07/22/16 1922 07/22/16 2325 07/23/16 0329 07/23/16 0604 07/23/16 0809  GLUCAP 90 99 118* 134* 127* 142*    Imaging Dg Chest Port 1 View  Result Date: 07/23/2016 CLINICAL DATA:  Respiratory failure. EXAM: PORTABLE CHEST 1 VIEW COMPARISON:  07/21/2016. FINDINGS: Interim placement of feeding tube, its tip is coiled in the stomach. Endotracheal tube, right PICC line, right chest tube in stable position. Tiny residual right apical pneumothorax cannot be excluded. Slight worsening of right lung infiltrate and atelectasis. Small right pleural effusion again noted. Heart size stable.  IMPRESSION: 1. Interim placement of feeding tube, its tip is coiled in the stomach. Endotracheal tube, right PICC line, right chest tube in stable position. Tiny residual right apical pneumothorax cannot be excluded. 2. Slight worsening of right lung infiltrate and atelectasis. Small right pleural effusion again noted. Electronically Signed   By: Marcello Moores  Register   On: 07/23/2016 06:47   Dg Abd Portable 1v  Result Date: 07/22/2016 CLINICAL DATA:  Evaluate tube placement. EXAM: PORTABLE ABDOMEN - 1 VIEW COMPARISON:  July 21, 2016 FINDINGS: A tube extends through the esophagus into the stomach. The right pleural effusion and underlying opacity is seen. IMPRESSION: A tube extends through the esophagus, terminating in the stomach. Right pleural effusion and underlying atelectasis. Electronically Signed   By: Dorise Bullion III M.D   On: 07/22/2016 12:52   STUDIES:  6/10 CT head> chronic white matter changes, no acute intracranial abnormality  CULTURES: 6/16 blood > 6/16 resp > GPR, GPC pairs, Rare budding yeast>>>  ANTIBIOTICS: 6/16 zosyn >  6/16 vanc > 6/17  SIGNIFICANT EVENTS: 6/8 admission 6/16 intubation for respiratory failure/pneumothorax  LINES/TUBES: 6/12 PICC >  6/16 ETT >  6/16 R pigtail pleural drainage catheter >    DISCUSSION: 83 y/o female with multi-orgain failure, septic shock, acute respiratory failure with hypoxemia due to aspiration pneumonia.    ASSESSMENT / PLAN:  Acute respiratory failure with hypoxemia in setting of Aspiration pneumonia. Complicated by right iatrogenic PTX COPD/emphysema -PCXR personally reviewed 6/18: worsening right sided airspace disease c/w prior imaging. Perhaps very small right apical ptx.  -weaning this am -->passed SBT -no fever spike, wbc ct trended down Plan:   Extubate Wean FIO2 Day 4 zosyn F/u cultures.  Cont BDs Cont CT to 20 cmH2O suction w/ flush q 8 hrs   Septic shock-->off pressors Plan:  Cont tele  Wean levo for  MAP > 65  AKI > worsening, due to shock-->labs repeated and still pending Plan:   Repeat chemistry  Renal dose meds Replace lytes as indicated   Hiatal hernia-->deemed NOT a surgical candidate d/t profound malnutrition and deconditioning Protein calorie malnutrition Plan:   Cont tube feeds.  PPI  Anemia of chronic illness Plan Trend cbc [place] heparin    Acute encephalopathy> oversedated 6/17 AM; now improved H/o depression  Plan:   Dc fent gtt Cont home meds PRN fent for pain only    FAMILY  - Updates: son updated extensively on 6/17; we elected to not escalate care.   now awake, making urine. Passed SBT. Ready for extubation. The challenge  will be that we still have the hiatal hernia so the issue that got her here in the first place has not resolved.  For today we will extubate Cont to rx pNa Cont tubefeeds Cont supportive care.  We will likely be looking at a palliative care situation in the future as long term nasogastric feedings are not a good option and her degree of deconditioning will be a major barrier to improvement.   My ccm time 34 minutes.   Erick Colace ACNP-BC [place] Pager # [phone] OR # [phone] if no answer   07/23/2016, 10:05 AM

## 2016-07-23 NOTE — Progress Notes (Signed)
Nutrition Follow-up  DOCUMENTATION CODES:   Underweight, Severe malnutrition in context of acute illness/injury  INTERVENTION:  - Will order Osmolite 1.2 @ 50 mL/hr which provides 1440 kcal, 67 grams of protein, and 984 mL free water. - Free water flush to be per MD/NP order.   NUTRITION DIAGNOSIS:   Malnutrition related to acute illness, nausea, vomiting as evidenced by percent weight loss, severe depletion of body fat, severe depletion of muscle mass -ongoing  GOAL:   Patient will meet greater than or equal to 90% of their needs -previously met, currently unmet with TF on hold pending extubation today.   MONITOR:   TF tolerance, Weight trends, Labs, I & O's  ASSESSMENT:   83 y.o. female with medical history significant for emphysema, B12 deficiency, hiatal hernia, and GERD, now presenting to the emergency department at the direction of her primary care physician for evaluation of dysphagia to solids with nausea, vomiting, and progressive weight loss.  6/18 Per discussion with RN and Laurey Arrow, PCCM NP, plan to extubate today and plan to continue TF following extubation (pt has small bore NGT/Panda tube in place). TF currently on hold. Reviewed results of portable CXR from yesterday which states that feeding tube/tube tip is coiled in the stomach; is past hiatal hernia. Pt currently ordered Vital 1.5 @ 40 mL/hr which provides 1440 kcal, 65 grams of protein, and 733 mL free water. Re-estimated nutrition needs this AM based on planned extubation and current medical course and malnutrition.   Medications reviewed; 2 tablets Oscal with D per Panda/day, 10 mg IV Pepcid BID, sliding scale Novolog, 50 mcg vitamin B12 per Panda/day.  Labs reviewed; CBGs: 127-142 mg/dL this AM, Mg and Phos were WDL yesterday at 1845.  IVF: D10 @ 30 mL/hr (245 kcal from dextrose).   6/17 - TPN has been stopped over the weekend.  - Pt now intubated and to start TF.  - Recommend slower advancement than PEPUP as  pt did not receive TPN at goal rate before d/c and was not tolerating PO intake well. - Pt was having episodes of N/V throughout admission.   Patient is currently intubated on ventilator support for pneumothorax MV: 12.5 L/min Temp (24hrs), Avg:99.3 F (37.4 C), Min:97.5 F (36.4 C), Max:102.2 F (39 C)   Diet Order:  Diet NPO time specified  Skin:  Reviewed, no issues  Last BM:  6/16  Height:   Ht Readings from Last 1 Encounters:  07/21/16 4' 11"  (1.499 m)    Weight:   Wt Readings from Last 1 Encounters:  07/23/16 96 lb 5.5 oz (43.7 kg)    Ideal Body Weight:  44.7 kg  BMI:  Body mass index is 19.46 kg/m.  Estimated Nutritional Needs:   Kcal:  1310-1530 (30-35 kcal/kg)  Protein:  57-70 grams (1.3-1.6 grams/kg)  Fluid:  1.3-1.5 L/day  EDUCATION NEEDS:   No education needs identified at this time    Jarome Matin, MS, RD, LDN, CNSC Inpatient Clinical Dietitian Pager # [phone] After hours/weekend pager # [phone]

## 2016-07-24 ENCOUNTER — Inpatient Hospital Stay (HOSPITAL_COMMUNITY): Payer: PPO

## 2016-07-24 DIAGNOSIS — J939 Pneumothorax, unspecified: Secondary | ICD-10-CM

## 2016-07-24 LAB — TYPE AND SCREEN
ABO/RH(D): B POS
Antibody Screen: NEGATIVE
Unit division: 0

## 2016-07-24 LAB — CBC
HCT: 24 % — ABNORMAL LOW (ref 36.0–46.0)
Hemoglobin: 8.3 g/dL — ABNORMAL LOW (ref 12.0–15.0)
MCH: 29 pg (ref 26.0–34.0)
MCHC: 34.6 g/dL (ref 30.0–36.0)
MCV: 83.9 fL (ref 78.0–100.0)
PLATELETS: 242 10*3/uL (ref 150–400)
RBC: 2.86 MIL/uL — ABNORMAL LOW (ref 3.87–5.11)
RDW: 14.5 % (ref 11.5–15.5)
WBC: 16.1 10*3/uL — ABNORMAL HIGH (ref 4.0–10.5)

## 2016-07-24 LAB — CREATININE, SERUM
CREATININE: 0.68 mg/dL (ref 0.44–1.00)
GFR calc Af Amer: >60 mL/min (ref >60)

## 2016-07-24 LAB — BASIC METABOLIC PANEL
Anion gap: 8 (ref 5–15)
BUN: 19 mg/dL (ref 6–20)
CALCIUM: 8.4 mg/dL — AB (ref 8.9–10.3)
CO2: 24 mmol/L (ref 22–32)
Chloride: 105 mmol/L (ref 101–111)
Creatinine, Ser: 0.69 mg/dL (ref 0.44–1.00)
GFR calc Af Amer: >60 mL/min (ref >60)
GLUCOSE: 120 mg/dL — AB (ref 65–99)
Potassium: 4.4 mmol/L (ref 3.5–5.1)
Sodium: 137 mmol/L (ref 135–145)

## 2016-07-24 LAB — GLUCOSE, CAPILLARY: GLUCOSE-CAPILLARY: 124 mg/dL — AB (ref 65–99)

## 2016-07-24 LAB — BPAM RBC
BLOOD PRODUCT EXPIRATION DATE: 201807072359
ISSUE DATE / TIME: 201806181518
UNIT TYPE AND RH: 7300

## 2016-07-24 MED ORDER — RANITIDINE HCL 150 MG/10ML PO SYRP
150.0000 mg | ORAL_SOLUTION | Freq: Every day | ORAL | Status: DC
  Administered 2016-07-24: 150 mg
  Filled 2016-07-24: qty 10

## 2016-07-24 MED ORDER — FUROSEMIDE 10 MG/ML IJ SOLN
40.0000 mg | Freq: Once | INTRAMUSCULAR | Status: AC
Start: 2016-07-24 — End: 2016-07-24
  Administered 2016-07-24: 40 mg via INTRAVENOUS
  Filled 2016-07-24: qty 4

## 2016-07-24 MED ORDER — POTASSIUM CHLORIDE 20 MEQ/15ML (10%) PO SOLN
40.0000 meq | Freq: Once | ORAL | Status: AC
  Administered 2016-07-24: 40 meq
  Filled 2016-07-24: qty 30

## 2016-07-24 MED ORDER — FLEET ENEMA 7-19 GM/118ML RE ENEM
1.0000 | ENEMA | Freq: Once | RECTAL | Status: AC
  Administered 2016-07-24: 1 via RECTAL
  Filled 2016-07-24: qty 1

## 2016-07-24 MED ORDER — ORAL CARE MOUTH RINSE
15.0000 mL | Freq: Two times a day (BID) | OROMUCOSAL | Status: DC
  Administered 2016-07-24 – 2016-07-26 (×3): 15 mL via OROMUCOSAL

## 2016-07-24 MED ORDER — CALCIUM CARBONATE ANTACID 500 MG PO CHEW
400.0000 mg | CHEWABLE_TABLET | Freq: Every day | ORAL | Status: DC
  Administered 2016-07-24: 400 mg
  Filled 2016-07-24: qty 2

## 2016-07-24 MED ORDER — MORPHINE SULFATE (PF) 2 MG/ML IV SOLN
0.5000 mg | INTRAVENOUS | Status: DC | PRN
  Administered 2016-07-24: 1 mg via INTRAVENOUS
  Administered 2016-07-24: 0.5 mg via INTRAVENOUS
  Administered 2016-07-24: 1 mg via INTRAVENOUS
  Administered 2016-07-25 (×2): 0.5 mg via INTRAVENOUS
  Filled 2016-07-24 (×5): qty 1

## 2016-07-24 NOTE — Progress Notes (Signed)
Nutrition Follow-up  DOCUMENTATION CODES:   Underweight, Severe malnutrition in context of acute illness/injury  INTERVENTION:  - Continue Osmolite 1.2 @ 50 mL/hr.  - Will monitor K, Mg, and Phos once D10 is d/c'ed.   NUTRITION DIAGNOSIS:   Malnutrition related to acute illness, nausea, vomiting as evidenced by percent weight loss, severe depletion of body fat, severe depletion of muscle mass. -ongoing  GOAL:   Patient will meet greater than or equal to 90% of their needs -met with TF regimen.   MONITOR:   TF tolerance, Weight trends, Labs, I & O's  ASSESSMENT:   83 y.o. female with medical history significant for emphysema, B12 deficiency, hiatal hernia, and GERD, now presenting to the emergency department at the direction of her primary care physician for evaluation of dysphagia to solids with nausea, vomiting, and progressive weight loss.  6/19 Pt was extubated yesterday AM and now on [place]. PCCM notes from yesterday state that pt is a do not reintubate. Panda in place and pt receiving TF at goal rate: Osmolite 1.2 @ 50 mL/hr which is providing 1440 kcal, 67 grams of protein, and 984 mL free water. Daughter and son at bedside. TF explained to them and they are very appreciative. Weight +3.8 kg from yesterday; will continue to monitor closely. At time of visit BP: 131/60 and MAP: 84. No new notes this AM. Talked with Dr. Elsworth Soho and plan is to d/c D10 today.   Medications reviewed; sliding scale Novolog, 40 mEq KCl per Panda x2 doses yesterday, 20 mEq IV KPhos x1 dose yesterday, 50 mcg vitamin B12 per Panda/day.  Labs reviewed; CBG: 124 mg/dL this AM, Phos: 2.4 mg/dL.  IVF: D10 @ 30 mL/hr.    6/18 - Per discussion with RN and Laurey Arrow, PCCM NP, plan to extubate today and plan to continue TF following extubation (pt has small bore NGT/Panda tube in place).  - TF currently on hold.  - Reviewed results of portable CXR from yesterday which states that feeding tube/tube tip is coiled in the  stomach; is past hiatal hernia. - Pt currently ordered Vital 1.5 @ 40 mL/hr which provides 1440 kcal, 65 grams of protein, and 733 mL free water.  - Re-estimated nutrition needs this AM based on planned extubation and current medical course and malnutrition.   IVF: D10 @ 30 mL/hr (245 kcal from dextrose).   6/17 - TPN has been stopped over the weekend.  - Pt now intubated and to start TF.  - Recommend slower advancement than PEPUP as pt did not receive TPN at goal rate before d/c and was not tolerating PO intake well. - Pt was having episodes of N/V throughout admission.   Patient is currently intubated on ventilator support for pneumothorax MV: 12.5L/min Temp (24hrs), Avg:99.3 F (37.4 C), Min:97.5 F (36.4 C), Max:102.2 F (39 C)   Diet Order:  Diet NPO time specified  Skin:  Reviewed, no issues  Last BM:  6/18  Height:   Ht Readings from Last 1 Encounters:  07/21/16 '4\' 11"'$  (1.499 m)    Weight:   Wt Readings from Last 1 Encounters:  07/24/16 104 lb 11.5 oz (47.5 kg)    Ideal Body Weight:  44.7 kg  BMI:  Body mass index is 21.15 kg/m.  Estimated Nutritional Needs:   Kcal:  1310-1530 (30-35 kcal/kg)  Protein:  57-70 grams (1.3-1.6 grams/kg)  Fluid:  1.3-1.5 L/day  EDUCATION NEEDS:   No education needs identified at this time    Jarome Matin,  MS, RD, LDN, CNSC Inpatient Clinical Dietitian Pager # [phone] After hours/weekend pager # [phone]  

## 2016-07-24 NOTE — Progress Notes (Signed)
PULMONARY / CRITICAL CARE MEDICINE   Name: Kelly Drake MRN: [id number] DOB: 1930-05-23    ADMISSION DATE:  07/15/2016 CONSULTATION DATE:  07/21/2016  REFERRING MD:  Hammonds  CHIEF COMPLAINT:  Respiratory failure  HISTORY OF PRESENT ILLNESS:   83 y/o female with a hiatal hernia and COPD who was hospitalized on 6/8 for malnutrition, failure to thrive and pneumonia in the setting of a large hiatal hernia who on 6/16 developed acute respiratory failure with hypoxemia necessitating intubation.  After intubation she was noted to have a R pneumothorax so PCCM placed a chest tube.    SUBJECTIVE:  Minimally responsive Respirations are labored  CXR worse   VITAL SIGNS: BP (!) 128/54   Pulse (!) 104   Temp 99.4 F (37.4 C) (Axillary)   Resp (!) 26   Ht 4\' 11"  (1.499 m)   Wt 104 lb 11.5 oz (47.5 kg)   SpO2 100%   BMI 21.15 kg/m   HEMODYNAMICS:    VENTILATOR SETTINGS:    INTAKE / OUTPUT:  Intake/Output Summary (Last 24 hours) at 07/24/16 1020 Last data filed at 07/24/16 1700  Gross per 24 hour  Intake          2469.55 ml  Output             1046 ml  Net          1423.55 ml   PHYSICAL EXAMINATION: General appearance:  83 Year old  female,  cachectic minimally responsive w/ labored resp efforts.  Eyes: anicteric sclerae, moist conjunctivae; PERRL, EOMI bilaterally. Mouth:  Membranes dry  Neck: Trachea midline; neck supple, no JVD Lungs/chest: labored w/ diffuse rhonchi and decreased R>L base. Chest tube w/out airleak, CV: RRR, no MRGs  Abdomen: Soft, non-tender; no masses or HSM Extremities: No peripheral edema or extremity lymphadenopathy Skin: Normal temperature, turgor and texture; no rash, ulcers or subcutaneous nodules Neuro/ Psych: minimally responsive BMET  Recent Labs Lab 07/21/16 0200 07/21/16 0500 07/22/16 0729 07/23/16 0929 07/24/16 0323  NA 138 135  --  136  --   K 4.1 4.9  --  3.2*  --   CL 113* 104  --  104  --   CO2 20* 23  --  23  --   BUN 20 27*   --  23*  --   CREATININE 0.66 0.93 1.09* 0.80 0.68  GLUCOSE 169* 121*  --  138*  --    Electrolytes  Recent Labs Lab 07/21/16 0200 07/21/16 0500  07/22/16 1845 07/23/16 0929 07/23/16 1955  CALCIUM 6.8* 8.7*  --   --  8.3*  --   MG 1.6*  --   < > 2.4 2.1 2.0  PHOS  --   --   < > 3.0 2.0* 2.4*  < > = values in this interval not displayed. CBC  Recent Labs Lab 07/23/16 0929 07/23/16 1130 07/24/16 0323  WBC 14.6* 15.5* 16.1*  HGB 6.3* 6.5* 8.3*  HCT 18.0* 18.5* 24.0*  PLT 224 230 242   Coag's No results for input(s): APTT, INR in the last 168 hours.  Sepsis Markers  Recent Labs Lab 07/21/16 0503 07/21/16 1023 07/22/16 0729 07/23/16 0929  LATICACIDVEN 1.5 2.0*  --   --   PROCALCITON 4.19  --  14.82 8.58    ABG  Recent Labs Lab 07/21/16 0033 07/21/16 0314 07/21/16 0516  PHART 7.109* 7.173* 7.194*  PCO2ART 80.4* 60.2* 58.7*  PO2ART 76.7* 73.1* 112*    Liver Enzymes  Recent  Labs Lab 07/20/16 0439 07/21/16 0503 07/23/16 0929  AST 22 27 21   ALT 18 20 13*  ALKPHOS 70 85 64  BILITOT 0.5 0.3 0.8  ALBUMIN 2.5* 2.2* 2.7*    Cardiac Enzymes No results for input(s): TROPONINI, PROBNP in the last 168 hours.  Glucose  Recent Labs Lab 07/23/16 0809 07/23/16 1224 07/23/16 1808 07/23/16 1913 07/23/16 2331 07/24/16 0545  GLUCAP 142* 152* 119* 119* 125* 124*    Imaging No results found. STUDIES:  6/10 CT head> chronic white matter changes, no acute intracranial abnormality  CULTURES: 6/16 blood > 6/16 resp >NPF  ANTIBIOTICS: 6/16 zosyn >  6/16 vanc > 6/17  SIGNIFICANT EVENTS: 6/8 admission 6/16 intubation for respiratory failure/pneumothorax  LINES/TUBES: 6/12 PICC >  6/16 ETT > 6/18 6/16 R pigtail pleural drainage catheter >    ASSESSMENT / PLAN:  Acute respiratory failure with hypoxemia in setting of Aspiration pneumonia. Complicated by right iatrogenic PTX COPD/emphysema -extubated 6/28 pcxr personally reviewed: no PTX.  Worsening bilateral aeration w/ marked decreased aeration on the right which looks like increased basilar consolidation as well as worsening airspace disease, and now new patchy airspace disease on left.  -worsening respiratory distress.  -poor cough Plan:   Wean oxygen Day 5 zosyn Cont bds Change chest tube to water seal dc in am if no airleak  Lasix x 1 No NTS Adding morphine for comfort.   Septic shock-->off pressors Plan:  Dc tele Comfort   AKI > worsening, due to shock-> had improved as of 6/18 Plan:   Dc labs  Hiatal hernia-->deemed NOT a surgical candidate d/t profound malnutrition and deconditioning Protein calorie malnutrition Constipation Plan:   Cont tubefeeds and PPI-->if gets high residuals will stop tubefeeds Fleets  Anemia of chronic illness hgb down as low as 6.5 got 1 unit blood 6/18 Plan scds  Acute encephalopathy> she is actively dying H/o depression  Plan:   Prn morphine   Discussion  83 y/o female with multi-orgain failure, septic shock, acute respiratory failure with hypoxemia due to aspiration pneumonia.   -we extubated her yesterday. She has progressive atelectasis. Right basilar collapse and worsening work of breathing. I do not think that she can survive. I spoke to her son WIll at bedside. We could consider NTS and hypertonic saline but given her degree of deconditioning and poor cough it is unlikely that this will help much and more likely that it could be uncomfortable for her and still not change her outcome. Based on our discussion we will transition to comfort based care.  -cont abx -cont oxygen -cont tubefeeds for now but dc if develops residuals -add PRN morphine  -limit labs.   My cc time 38 minutes.   Erick Colace ACNP-BC [place] Pager # [phone] OR # [phone] if no answer    07/24/2016, 10:20 AM

## 2016-07-25 ENCOUNTER — Inpatient Hospital Stay (HOSPITAL_COMMUNITY): Payer: PPO

## 2016-07-25 DIAGNOSIS — Z515 Encounter for palliative care: Secondary | ICD-10-CM

## 2016-07-25 DIAGNOSIS — J9311 Primary spontaneous pneumothorax: Secondary | ICD-10-CM

## 2016-07-25 DIAGNOSIS — J939 Pneumothorax, unspecified: Secondary | ICD-10-CM

## 2016-07-25 MED ORDER — MORPHINE BOLUS VIA INFUSION
5.0000 mg | INTRAVENOUS | Status: DC | PRN
Start: 2016-07-25 — End: 2016-07-27
  Filled 2016-07-25: qty 20

## 2016-07-25 MED ORDER — SCOPOLAMINE 1 MG/3DAYS TD PT72
1.0000 | MEDICATED_PATCH | TRANSDERMAL | Status: DC
  Filled 2016-07-25: qty 1

## 2016-07-25 MED ORDER — SODIUM CHLORIDE 0.9 % IV SOLN
5.0000 mg/h | INTRAVENOUS | Status: DC
  Administered 2016-07-25 – 2016-07-26 (×2): 5 mg/h via INTRAVENOUS
  Filled 2016-07-25 (×2): qty 10

## 2016-07-25 MED ORDER — MORPHINE SULFATE (PF) 2 MG/ML IV SOLN
1.0000 mg | INTRAVENOUS | Status: DC | PRN
  Administered 2016-07-25: 1 mg via INTRAVENOUS
  Filled 2016-07-25: qty 1

## 2016-07-25 MED ORDER — LORAZEPAM 2 MG/ML IJ SOLN: 0.5000 mg | INTRAMUSCULAR | Status: DC | PRN

## 2016-07-25 MED ORDER — GLYCOPYRROLATE 0.2 MG/ML IJ SOLN
0.2000 mg | INTRAMUSCULAR | Status: DC | PRN
  Filled 2016-07-25: qty 1

## 2016-07-25 MED ORDER — ATROPINE SULFATE 1 % OP SOLN
2.0000 [drp] | Freq: Four times a day (QID) | OPHTHALMIC | Status: DC
  Administered 2016-07-25 – 2016-07-26 (×4): 2 [drp] via SUBLINGUAL
  Filled 2016-07-25 (×2): qty 2

## 2016-07-25 NOTE — Progress Notes (Signed)
OT Cancellation Note  Patient Details Name: Kelly Drake MRN: [id number] DOB: [birth year]   Cancelled Treatment:     Noted comfort measures only. Will sign off   [place], Thereasa Parkin 07/25/2016, 12:33 PM

## 2016-07-25 NOTE — Progress Notes (Signed)
CSW continues to follow pt to assist with d/c planning. PN reviewed. Palliative Care Team is assisting with [place]. Pt is now comfort care and a hospital death is anticipated. CSW will remain available to assist with residential hospice home placement, if appropriate.  Werner Lean LCSW [phone]

## 2016-07-25 NOTE — Progress Notes (Signed)
PROGRESS NOTE [facility]   Kelly Drake   NFA:[id number] DOB: 05-24-1930  DOA: 07/24/2016 PCP: Rusty Aus, MD   Brief Narrative:  83 y.o. Female with significant HX of COPd, Hiatal hernia, HTN, dementia, and B12 deficiency was admitted on 07/26/2016 with failure to thrive with N/V, dysphagia, and weight loss. On 6/15 she had significant leukocytosis (32k). She has developed delirium during the hospital stay. She was intubated on 6/16 after O2 stats significantly dropped. After intubation she was found to have suspected aspiration pneumonia and a tension pneumothorax, on 6/16 a chest tube was placed and she was started on vasopressors. She was extubated on 6/18. Receiving nutrition through NG tube. Patient did not follow commands at bedside today.   Assessment & Plan:   Principal Problem:   Acute kidney injury ([place]) Active Problems:   HYPERTENSION, BENIGN   COPD with emphysema (HCC)   Hypokalemia   Dysphagia   Normocytic anemia   Hiatal hernia   Prolonged QT interval   Acute on chronic respiratory failure with hypoxemia (HCC)  Acute kidney injury: Improved since last labs. Limit labs via critical care team. Comfort care.  Acute on chronic respiratory failure with hypoxemia: Due to left-side tension pneumothorax, and suspected aspiration pneumonia. Chest tube set to suction, remove chest tube today. Intubated 6/16, extubated 6/18. On 6L O2 via [place], SpO2 94%. Resp. 22. Continue to monitor. DNR. Morphine for comfort.  COPD: Continue pulmicort and brovana nebs.  Septic shock due to suspected aspiration pneumonia: No pressors, Telemetry D/c yesterday. Comfort care. DNR.  Normocytic anemia: Hgb increased from 6.5 to 8.3 after transfusion.  Dysphagia: Feeds via NG tube.  Hiatal hernia: Not a surgical candidate. On comfort care.  Leukocytosis: 16.1 today. Trending up. Most likely due to suspected aspiration pneumonia.  Delirium: Not improved. On comfort care  measures.  Hypomagnesemia: Normalized for past 3 days.  Hypokalemia: Normalized.   HTN: Controlled, 123/63. Continue to monitor.   DVT prophylaxis: SCDs Code Status: DNR Family Communication: None at bedside Disposition Plan: Unknown   Consultants:   Pulmonology   Critical care  Palliative care  Procedures:  -Chest tube - intubated 6/16, extubated 6/18 -NG tube in place  Antimicrobials:  Zosyn   Subjective: Pt seen and examined. Exam and hx limited. Patient did not follow commands at bedside.  Objective: Vitals:   07/25/16 0400 07/25/16 0500 07/25/16 0600 07/25/16 0800  BP: 123/63     Pulse: 92 92 87   Resp: 19 (!) 27 (!) 22   Temp:    97.5 F (36.4 C)  TempSrc:    Axillary  SpO2: 95% 92% 94%   Weight:      Height:        Intake/Output Summary (Last 24 hours) at 07/25/16 1014 Last data filed at 07/25/16 0600  Gross per 24 hour  Intake           1644.5 ml  Output             2805 ml  Net          -1160.5 ml   Filed Weights   07/22/16 0608 07/23/16 0438 07/24/16 0300  Weight: 91 lb 7.9 oz (41.5 kg) 96 lb 5.5 oz (43.7 kg) 104 lb 11.5 oz (47.5 kg)    Examination:  General exam: Appears frail, weak, not responding to commands HEENT: Mucus membranes dry Respiratory system: Chest tube sealed, no leaks. Central nervous system: Unresponsive to voice. Eyes open but not tracking. Extremities: No  pedal edema. Skin: No rashes, lesions or ulcers   Data Reviewed: I have personally reviewed following labs and imaging studies  CBC:  Recent Labs Lab 07/20/16 1505 07/21/16 0503 07/23/16 0929 07/23/16 1130 07/24/16 0323  WBC 25.9* 10.8* 14.6* 15.5* 16.1*  NEUTROABS 23.8* 9.8* 12.8* 13.6*  --   HGB 8.8* 9.3* 6.3* 6.5* 8.3*  HCT 25.8* 29.0* 18.0* 18.5* 24.0*  MCV 86.3 89.0 83.7 82.6 83.9  PLT 315 366 224 230 643   Basic Metabolic Panel:  Recent Labs Lab 07/19/16 0441 07/20/16 0439 07/21/16 0200 07/21/16 0500 07/22/16 0729 07/22/16 1124  07/22/16 1845 07/23/16 0929 07/23/16 1955 07/24/16 0323  NA 136 135 138 135  --   --   --  136  --  137  K 3.3* 4.2 4.1 4.9  --   --   --  3.2*  --  4.4  CL 99* 101 113* 104  --   --   --  104  --  105  CO2 29 26 20* 23  --   --   --  23  --  24  GLUCOSE 135* 103* 169* 121*  --   --   --  138*  --  120*  BUN 11 17 20  27*  --   --   --  23*  --  19  CREATININE 0.69 0.76 0.66 0.93 1.09*  --   --  0.80  --  0.69  0.68  CALCIUM 8.6* 9.2 6.8* 8.7*  --   --   --  8.3*  --  8.4*  MG 1.9  --  1.6*  --   --  2.3 2.4 2.1 2.0  --   PHOS 3.4  --   --   --   --  3.2 3.0 2.0* 2.4*  --    GFR: Estimated Creatinine Clearance: 35.7 mL/min (by C-G formula based on SCr of 0.68 mg/dL). Liver Function Tests:  Recent Labs Lab 07/19/16 0441 07/20/16 0439 07/21/16 0503 07/23/16 0929  AST 19 22 27 21   ALT 15 18 20  13*  ALKPHOS 66 70 85 64  BILITOT 0.5 0.5 0.3 0.8  PROT 5.0* 5.3* 5.1* 4.9*  ALBUMIN 2.5* 2.5* 2.2* 2.7*   No results for input(s): LIPASE, AMYLASE in the last 168 hours. No results for input(s): AMMONIA in the last 168 hours. Coagulation Profile: No results for input(s): INR, PROTIME in the last 168 hours. Cardiac Enzymes: No results for input(s): CKTOTAL, CKMB, CKMBINDEX, TROPONINI in the last 168 hours. BNP (last 3 results) No results for input(s): PROBNP in the last 8760 hours. HbA1C: No results for input(s): HGBA1C in the last 72 hours. CBG:  Recent Labs Lab 07/23/16 1224 07/23/16 1808 07/23/16 1913 07/23/16 2331 07/24/16 0545  GLUCAP 152* 119* 119* 125* 124*   Lipid Profile:  Recent Labs  07/23/16 0929  TRIG 59   Thyroid Function Tests: No results for input(s): TSH, T4TOTAL, FREET4, T3FREE, THYROIDAB in the last 72 hours. Anemia Panel: No results for input(s): VITAMINB12, FOLATE, FERRITIN, TIBC, IRON, RETICCTPCT in the last 72 hours. Sepsis Labs:  Recent Labs Lab 07/21/16 0503 07/21/16 1023 07/22/16 0729 07/23/16 0929  PROCALCITON 4.19  --  14.82 8.58   LATICACIDVEN 1.5 2.0*  --   --     Recent Results (from the past 240 hour(s))  Urine Culture     Status: None   Collection Time: 07/15/16  4:20 PM  Result Value Ref Range Status   Specimen Description URINE, CLEAN  CATCH  Final   Special Requests NONE  Final   Culture   Final    NO GROWTH Performed at [facility], [place] [address], [place], [place] [address]    Report Status 07/17/2016 FINAL  Final  Culture, expectorated sputum-assessment     Status: None   Collection Time: 07/21/16  3:40 AM  Result Value Ref Range Status   Specimen Description TRACHEAL ASPIRATE  Final   Special Requests NONE  Final   Sputum evaluation THIS SPECIMEN IS ACCEPTABLE FOR SPUTUM CULTURE  Final   Report Status 07/21/2016 FINAL  Final  Culture, respiratory (NON-Expectorated)     Status: None   Collection Time: 07/21/16  3:40 AM  Result Value Ref Range Status   Specimen Description TRACHEAL ASPIRATE  Final   Special Requests NONE Reflexed from [id number]  Final   Gram Stain   Final    FEW WBC PRESENT, PREDOMINANTLY PMN RARE GRAM POSITIVE RODS RARE GRAM POSITIVE COCCI IN PAIRS RARE BUDDING YEAST SEEN    Culture   Final    Consistent with normal respiratory flora. Performed at [facility], [place] [address] [place], [place] [address]    Report Status 07/23/2016 FINAL  Final  MRSA PCR Screening     Status: None   Collection Time: 07/21/16  5:09 AM  Result Value Ref Range Status   MRSA by PCR NEGATIVE NEGATIVE Final    Comment:        The GeneXpert MRSA Assay (FDA approved for NASAL specimens only), is one component of a comprehensive MRSA colonization surveillance program. It is not intended to diagnose MRSA infection nor to guide or monitor treatment for MRSA infections.   Culture, blood (routine x 2)     Status: None (Preliminary result)   Collection Time: 07/21/16  5:51 AM  Result Value Ref Range Status   Specimen Description BLOOD LEFT ARM  Final   Special Requests IN PEDIATRIC  BOTTLE Blood Culture adequate volume  Final   Culture   Final    NO GROWTH 3 DAYS Performed at [facility], [address], [place], [place] [address]    Report Status PENDING  Incomplete  Culture, blood (routine x 2)     Status: None (Preliminary result)   Collection Time: 07/21/16  6:20 AM  Result Value Ref Range Status   Specimen Description BLOOD RIGHT HAND  Final   Special Requests IN PEDIATRIC BOTTLE Blood Culture adequate volume  Final   Culture   Final    NO GROWTH 3 DAYS Performed at [facility], [place] [address], [place], [place] [address]    Report Status PENDING  Incomplete         Radiology Studies: Dg Chest Port 1 View  Result Date: 07/25/2016 CLINICAL DATA:  Follow-up right pneumothorax EXAM: PORTABLE CHEST 1 VIEW COMPARISON:  07/24/2016 FINDINGS: Cardiac shadow is stable. A feeding catheter is noted within the stomach. Right-sided PICC line and right-sided pigtail thoracostomy catheter are seen and stable. No pneumothorax is noted bilateral pleural effusions right greater than left are noted as well as bilateral infiltrates stable from the prior exam. IMPRESSION: Overall stable appearance of the chest. No recurrent pneumothorax is noted. Electronically Signed   By: Inez Catalina M.D.   On: 07/25/2016 08:34   Dg Chest Port 1 View  Result Date: 07/24/2016 CLINICAL DATA:  Recent right-sided chest tube placement EXAM: PORTABLE CHEST 1 VIEW COMPARISON:  07/23/2016 FINDINGS: Cardiac shadow is mildly  enlarged but stable. A feeding catheter is noted within the stomach. Right-sided PICC line is noted at the cavoatrial junction. A right pigtail thoracostomy catheter is noted in the apex. No pneumothorax is seen. Increasing right pleural effusion is noted. Stable right basilar infiltrate is seen with some increase in patchy infiltrates in the left upper and left lower lobes. IMPRESSION: Increase in bilateral infiltrates particularly on the left. No definitive  pneumothorax. Enlarging right pleural effusion. Electronically Signed   By: Inez Catalina M.D.   On: 07/24/2016 10:25      Scheduled Meds: . arformoterol  15 mcg Nebulization BID  . atropine  2 drop Sublingual QID  . budesonide (PULMICORT) nebulizer solution  0.5 mg Nebulization BID  . mouth rinse  15 mL Mouth Rinse BID  . scopolamine  1 patch Transdermal Q72H  . sodium chloride flush  10-40 mL Intracatheter Q12H   Continuous Infusions: . morphine 5 mg/hr (07/25/16 0942)     LOS: 12 days     Aamirah Salmi, PA-s  If 7PM-7AM, please contact night-coverage [email or web address] Password [email or web address] 07/25/2016, 10:14 AM

## 2016-07-25 NOTE — Progress Notes (Signed)
Chest tube removed Dressing applied Pt tolerated well  Erick Colace ACNP-BC [place] Pager # [phone] OR # [phone] if no answer

## 2016-07-25 NOTE — Progress Notes (Signed)
PULMONARY / CRITICAL CARE MEDICINE   Name: Kelly Drake MRN: [id number] DOB: 04/06/1930    ADMISSION DATE:  07/12/2016 CONSULTATION DATE:  07/21/2016  REFERRING MD:  Hammonds  CHIEF COMPLAINT:  Respiratory failure  HISTORY OF PRESENT ILLNESS:   83 y/o female with a hiatal hernia and COPD who was hospitalized on 6/8 for malnutrition, failure to thrive and pneumonia in the setting of a large hiatal hernia who on 6/16 developed acute respiratory failure with hypoxemia necessitating intubation.  After intubation she was noted to have a R pneumothorax so PCCM placed a chest tube.    SUBJECTIVE:  Minimally responsive Respirations are labored  CXR worse   VITAL SIGNS: BP 123/63 (BP Location: Left Arm)   Pulse 87   Temp 98 F (36.7 C) (Axillary)   Resp (!) 22   Ht 4\' 11"  (1.499 m)   Wt 104 lb 11.5 oz (47.5 kg)   SpO2 94%   BMI 21.15 kg/m   HEMODYNAMICS:    VENTILATOR SETTINGS:    INTAKE / OUTPUT:  Intake/Output Summary (Last 24 hours) at 07/25/16 2119 Last data filed at 07/25/16 0600  Gross per 24 hour  Intake             1722 ml  Output             2805 ml  Net            -1083 ml   PHYSICAL EXAMINATION: General appearance:  83 Year old female, cachectic  NAD, not responsive. terminally ill Eyes: anicteric sclerae, dry conjunctivae Mouth:  Dry membranes  Neck: Trachea midline; neck supple, no JVD Lungs/chest:  Labored diffuse rhonchi, chest tube w/out airleakCV: RRR, no MRGs  Abdomen: Soft, non-tender; no masses or HSM Extremities: No peripheral edema or extremity lymphadenopathy Skin: Normal temperature, turgor and texture; no rash, ulcers or subcutaneous nodules Neuro/Psych: eyes open but does not track or respond. I did not attempt noxious or painful stim  BMET  Recent Labs Lab 07/21/16 0500 07/22/16 0729 07/23/16 0929 07/24/16 0323  NA 135  --  136 137  K 4.9  --  3.2* 4.4  CL 104  --  104 105  CO2 23  --  23 24  BUN 27*  --  23* 19  CREATININE 0.93  1.09* 0.80 0.69  0.68  GLUCOSE 121*  --  138* 120*   Electrolytes  Recent Labs Lab 07/21/16 0500  07/22/16 1845 07/23/16 0929 07/23/16 1955 07/24/16 0323  CALCIUM 8.7*  --   --  8.3*  --  8.4*  MG  --   < > 2.4 2.1 2.0  --   PHOS  --   < > 3.0 2.0* 2.4*  --   < > = values in this interval not displayed. CBC  Recent Labs Lab 07/23/16 0929 07/23/16 1130 07/24/16 0323  WBC 14.6* 15.5* 16.1*  HGB 6.3* 6.5* 8.3*  HCT 18.0* 18.5* 24.0*  PLT 224 230 242   Coag's No results for input(s): APTT, INR in the last 168 hours.  Sepsis Markers  Recent Labs Lab 07/21/16 0503 07/21/16 1023 07/22/16 0729 07/23/16 0929  LATICACIDVEN 1.5 2.0*  --   --   PROCALCITON 4.19  --  14.82 8.58    ABG  Recent Labs Lab 07/21/16 0033 07/21/16 0314 07/21/16 0516  PHART 7.109* 7.173* 7.194*  PCO2ART 80.4* 60.2* 58.7*  PO2ART 76.7* 73.1* 112*    Liver Enzymes  Recent Labs Lab 07/20/16 0439 07/21/16 0503 07/23/16  0929  AST 22 27 21   ALT 18 20 13*  ALKPHOS 70 85 64  BILITOT 0.5 0.3 0.8  ALBUMIN 2.5* 2.2* 2.7*    Cardiac Enzymes No results for input(s): TROPONINI, PROBNP in the last 168 hours.  Glucose  Recent Labs Lab 07/23/16 0809 07/23/16 1224 07/23/16 1808 07/23/16 1913 07/23/16 2331 07/24/16 0545  GLUCAP 142* 152* 119* 119* 125* 124*    Imaging Dg Chest Port 1 View  Result Date: 07/24/2016 CLINICAL DATA:  Recent right-sided chest tube placement EXAM: PORTABLE CHEST 1 VIEW COMPARISON:  07/23/2016 FINDINGS: Cardiac shadow is mildly enlarged but stable. A feeding catheter is noted within the stomach. Right-sided PICC line is noted at the cavoatrial junction. A right pigtail thoracostomy catheter is noted in the apex. No pneumothorax is seen. Increasing right pleural effusion is noted. Stable right basilar infiltrate is seen with some increase in patchy infiltrates in the left upper and left lower lobes. IMPRESSION: Increase in bilateral infiltrates particularly on  the left. No definitive pneumothorax. Enlarging right pleural effusion. Electronically Signed   By: Inez Catalina M.D.   On: 07/24/2016 10:25   STUDIES:  6/10 CT head> chronic white matter changes, no acute intracranial abnormality  CULTURES: 6/16 blood > 6/16 resp >NPF  ANTIBIOTICS: 6/16 zosyn >  6/16 vanc > 6/17  SIGNIFICANT EVENTS: 6/8 admission 6/16 intubation for respiratory failure/pneumothorax 6/18 extubated 6/19 worsening MS and cxr. Transitioned to comfort.   LINES/TUBES: 6/12 PICC >  6/16 ETT > 6/18 6/16 R pigtail pleural drainage catheter >    ASSESSMENT / PLAN:  Acute respiratory failure with hypoxemia in setting of Aspiration pneumonia. Complicated by right iatrogenic PTX COPD/emphysema Septic shock-->off pressors AKI > worsening, due to shock-> had improved as of 6/18 Hiatal hernia-->deemed NOT a surgical candidate d/t profound malnutrition and deconditioning Protein calorie malnutrition Constipation Anemia of chronic illness Acute encephalopathy> she is actively dying H/o depression   Discussion FTT in setting of hiatal hernia & profound protein calorie malnutrition. Acutely complicated by aspiration PNA and septic shock. She was able to improve enough to get extubated, but does not have the caloric/energy reserves to clear her airway and has subsequently become progressively atelectatic and less responsive. We discussed treatment options w/ family yesterday and decided that transition to comfort was the most dignified approach to her care. As of 6/20 she remains labored, she is unresponsive and terminally ill. I did order a CXR for today. If the PTX remains resolved we will remove the chest tube in effort to continue to focus on comfort.   Plan Adjust morphine (she looks uncomfortable) CXR w/ plan to dc chest tube if no PTX Cont abx for now Cont tubefeeds as long as no sig residuals.  I suspect we will be transitioning to full comfort & even morphine gtt  in near future should her labored respirations not improve.   Erick Colace ACNP-BC [place] Pager # [phone] OR # [phone] if no answer   Erick Colace ACNP-BC [place] Pager # [phone] OR # [phone] if no answer    07/25/2016, 8:11 AM

## 2016-07-25 NOTE — Progress Notes (Signed)
Spoke w/ family We will transition to comfort Dc abx, tubefeeds Start morphine gtt  Erick Colace ACNP-BC [place] Pager # [phone] OR # [phone] if no answer

## 2016-07-25 NOTE — Consult Note (Signed)
Consultation Note Date: 07/25/2016   Patient Name: Kelly Drake  DOB: December 15, 1930  MRN: [id number]  Age / Sex: 83 y.o., female  PCP: Rusty Aus, MD Referring Physician: Rosita Fire, MD  Reason for Consultation: Establishing goals of care and Psychosocial/spiritual support  HPI/Patient Profile: 83 y.o. female  with past medical history of emphysema, hiatal hernia, and GERD. She presented to her PCP with dysphagia to solids with progressive weight loss from resultant nausea and vomiting with eating. The hiatal hernia was determined to be the culprit, with surgery planned for mid-June. Unfortunately, in preoperative evaluation her blood work was concerning for dehydration with resultant AKI and electrolyte abnormalities. This corresponded to her report of minimal intake for the preceding three weeks. She was directed to the ED and was admitted on 07/18/2016. Her hospital course was complicated by AMS/delirium, and on 6/16 she developed acute respiratory failure that required intubation. Following intubation she developed a right sided tension pneumothorax with need for chest tube. Respiratory failure likely secondary to aspiration pneumonia with resultant shock. She was extubated 6/18 but has not done well with ongoing respiratory distress and minimally responsive. Critical care discussed the situation with pt's son, who elected to transition to comfort based care. Palliative consulted to assist in goals of care and supporting pt/family.   Clinical Assessment and Goals of Care: I met Kelly Drake at her bedside. She is no longer responsive. Her son, his wife, and two close friends were present at the bedside. I shared my role in taking care of her, and emphasized the goal of keeping her comfortable. Her son was appreciative of the extra support in taking care of her, and he did verbalize the plan to focus on comfort and dignity as she transitions to death.     At their request, I talked through how to assess behavioral signs to monitor for pain or discomfort. At present she appears very comfortable. Her son also wanted to learn more about signs to monitor for that would indicate death is imminent. I provided that education and reinforced that these changes (such a a varied breathing pattern, abnormal vital signs, and cool or mottling extremities) were expected and were not uncomfortable for Kelly Drake. I reviewed her medications available for comfort, and encouraged family and friends at the bedside to alert the care nurse to any sign they were concerned about.   Primary Decision Maker Pt's son   SUMMARY OF RECOMMENDATIONS    Full comfort care  Medications adjusted for additional symptom management  I suspect she will die in the hospital. If she remains here tomorrow and is stable, I will broach the option of transitioning to a residential hospice  Code Status/Advance Care Planning:  DNR  Palliative Prophylaxis:   Frequent Pain Assessment, Oral Care and Turn Reposition  Additional Recommendations (Limitations, Scope, Preferences):  Full Comfort Care  Psycho-social/Spiritual:   Desire for further Chaplaincy support:no  Additional Recommendations: May need education on residential hospice, will re-evaluate tomorrow  Prognosis:   Hours - Days  Discharge Planning: Anticipated Hospital Death      Primary Diagnoses: Present on Admission: . Acute kidney injury ([place]) . Hypokalemia . HYPERTENSION, BENIGN . COPD with emphysema ([place]) . Normocytic anemia . Prolonged QT interval   I have reviewed the medical record, interviewed the patient and family, and examined the patient. The following aspects are pertinent.  Past Medical History:  Diagnosis Date  . Asthma   . B12 deficiency   . Bladder cancer ([place])   .  COPD (chronic obstructive pulmonary disease) ([place])   . Cystitis   . Fibrocystic breast disease   . GERD  (gastroesophageal reflux disease)   . Hematuria   . History of hiatal hernia   . Hyperlipidemia   . Hypertension   . IBS (irritable bowel syndrome)   . Osteoporosis    Social History   Social History  . Marital status: Widowed    Spouse name: N/A  . Number of children: N/A  . Years of education: N/A   Occupational History  . Retired    Social History Main Topics  . Smoking status: Former Smoker    Packs/day: 0.50    Years: 3.00    Types: Cigarettes    Quit date: 02/05/1962  . Smokeless tobacco: Never Used  . Alcohol use No  . Drug use: No  . Sexual activity: Not Asked   Other Topics Concern  . None   Social History Narrative  . None   Family History  Problem Relation Age of Onset  . Allergies Mother   . Colon cancer Father   . Emphysema Brother    Scheduled Meds: . arformoterol  15 mcg Nebulization BID  . budesonide (PULMICORT) nebulizer solution  0.5 mg Nebulization BID  . DULoxetine  20 mg Oral Daily  . mouth rinse  15 mL Mouth Rinse BID  . ranitidine  150 mg Per Tube QHS  . sodium chloride flush  10-40 mL Intracatheter Q12H  . sodium chloride flush  3 mL Intravenous Q12H  . vitamin B-12  50 mcg Oral Daily   Continuous Infusions: . feeding supplement (OSMOLITE 1.2 CAL) 1,000 mL (07/25/16 0604)  . piperacillin-tazobactam 3.375 g (07/25/16 0406)  . valproate sodium Stopped (07/24/16 2327)   PRN Meds:.acetaminophen **OR** acetaminophen, bisacodyl, morphine injection, ondansetron (ZOFRAN) IV, polyethylene glycol, polyvinyl alcohol, sodium chloride flush, sodium chloride flush Allergies  Allergen Reactions  . Sulfonamide Derivatives    Review of Systems: Unable to obtain, pt unresponsive.  Physical Exam  Constitutional: She appears cachectic. She has a sickly appearance. No distress. Nasal cannula in place.  HENT:  Head: Normocephalic and atraumatic.  Mouth/Throat: Mucous membranes are dry. Abnormal dentition.  Cardiovascular: Normal rate and regular  rhythm.   Pulmonary/Chest: She has rhonchi (throughout).  Breathing rhythmic, however shallow with slight tachypnea (RR 24). Unlabored.  Abdominal: Soft. Bowel sounds are decreased.  Musculoskeletal: She exhibits no edema.  Unresponsive in bed, not moving. Will spontaneously move fingers  Neurological: She is unresponsive.  Skin: Skin is warm and dry. There is pallor.  Extremities warm, no mottling noted  Psychiatric:  UTA, pt unresponsive. Appears calm with no signs of distress   Vital Signs: BP 123/63 (BP Location: Left Arm)   Pulse 87   Temp 98 F (36.7 C) (Axillary)   Resp (!) 22   Ht _0  (1.499 m)   Wt 47.5 kg (104 lb 11.5 oz)   SpO2 94%   BMI 21.15 kg/m  Pain Assessment: PAINAD POSS *See Group Information*: 1-Acceptable,Awake and alert Pain Score: 0-No pain   SpO2: SpO2: 94 % O2 Device:SpO2: 94 % O2 Flow Rate: .O2 Flow Rate (L/min): 5 L/min  IO: Intake/output summary:  Intake/Output Summary (Last 24 hours) at 07/25/16 0802 Last data filed at 07/25/16 0600  Gross per 24 hour  Intake             1722 ml  Output             2805 ml  Net            -  1083 ml    LBM: Last BM Date: 07/24/16 Baseline Weight: Weight: 42.2 kg (93 lb) Most recent weight: Weight: 47.5 kg (104 lb 11.5 oz)     Palliative Assessment/Data: PPS 10%    Time Total: 50 minutes Greater than 50%  of this time was spent counseling and coordinating care related to the above assessment and plan.  Signed by: Charlynn Court, NP Palliative Medicine Team Pager # [phone] (M-F 7a-5p) Team Phone # [phone] (Nights/Weekends)

## 2016-07-25 NOTE — Progress Notes (Signed)
Date:  July 25, 2016  Chart reviewed for concurrent status and case management needs.  Will continue to follow patient progress.  Transitioning to comfort care/tube feeds and abx dcd/iv ms gtt started. Discharge Planning: following for needs  Expected discharge date: [id number]  Velva Harman, BSN, [place], [place]

## 2016-07-25 NOTE — Progress Notes (Signed)
Nutrition Brief Note  Chart reviewed. TFs are being discontinued. Pt now transitioning to comfort care.  No further nutrition interventions warranted at this time.   Clayton Bibles, MS, RD, LDN Pager: [phone] After Hours Pager: [phone]

## 2016-07-26 DIAGNOSIS — Z8719 Personal history of other diseases of the digestive system: Secondary | ICD-10-CM

## 2016-07-26 DIAGNOSIS — J9601 Acute respiratory failure with hypoxia: Secondary | ICD-10-CM

## 2016-07-26 LAB — CULTURE, BLOOD (ROUTINE X 2)
CULTURE: NO GROWTH
Culture: NO GROWTH
SPECIAL REQUESTS: ADEQUATE
SPECIAL REQUESTS: ADEQUATE

## 2016-07-26 NOTE — Progress Notes (Signed)
Palliative Care Progress Note  Kelly Drake remains unresponsive. She does not blink to threat, nor respond to verbal or physical stimuli. Her fingertips are now mottled and cool, with some extension up her fingers toward her palm on the left hand. Her breathing remains shallow, however it is unlabored. No signs of discomfort or distress. Vitals reviewed from this morning, notable for hypotension (65/37) and hypoxia (80%). She is clearly transitioning towards death, which I would expect in hours to a day. Given her likely prognosis and unstable vitals, I would not recommend transitioning her out of the hospital. This can be re-assessed tomorrow, however I would anticipate a hospital death. No family at the bedside. Plan discussed with care nurse.  PLAN -DNR -Full comfort measures; anticipate a hospital death  Total time: 15 minutes Greater than 50%  of this time was spent counseling and coordinating care related to the above assessment and plan.  Charlynn Court, NP Palliative Medicine Team Pager # [phone] (M-F 7a-5p) Team Phone # [phone] (Nights/Weekends)

## 2016-07-26 NOTE — Progress Notes (Signed)
   07/26/16 1000  Clinical Encounter Type  Visited With Patient and family together  Visit Type Initial;Psychological support;Spiritual support;Patient actively dying  Referral From Nurse  Consult/Referral To Chaplain  Spiritual Encounters  Spiritual Needs Prayer;Emotional;Grief support;Other (Comment) (Pastoral Conversation/Support)  Stress Factors  Patient Stress Factors Not reviewed  Family Stress Factors Loss;Major life changes   I visited with the patient's family at her bedside upon Spiritual Care referral by the nurse. The patient was not able to communicate or respond to my visit.  The patient's son and his girlfriend were present at the bedside and tearful. They told me about the patient and how she had been a wonderful mother.  They requested prayer for the patient and wanted her to be at peace.  We prayed at the bedside.   Please, contact Spiritual Care for further support.   [place] M.Div.

## 2016-07-26 NOTE — Progress Notes (Signed)
PROGRESS NOTE    Kelly Drake  JJH:[id number] DOB: 01-Dec-1930 DOA: 07/19/2016 PCP: Rusty Aus, MD   Brief Narrative: 83 year old female with history of hiatal hernia, COPD admitted for failure to thrive, aspiration pneumonia in the setting of large hiatal hernia who developed acute respiratory failure requiring intubation. After intubation patient was found to have right pneumothorax status post chest tube was placed. She was extubated. Required pressors for septic shock. The chest tube was removed today. After discussion with the family the patient was made comfort care, started on morphine by critical care team and transferred to [facility]. Patient with poor prognosis  Assessment & Plan:   Principal Problem:   AKI (acute kidney injury) ([place]) Active Problems:   HYPERTENSION, BENIGN   COPD with emphysema ([facility])   Hypokalemia   Dysphagia   Normocytic anemia   Hiatal hernia   Prolonged QT interval   Acute on chronic respiratory failure with hypoxemia ([facility])   Pneumothorax   Comfort measures only status   Palliative care by specialist   Terminal care   History of hiatal hernia   Acute respiratory failure with hypoxia ([place])  Comfort care with poor prognosis: Patient was made comfort care by critical care and transferred to our group. Evaluated by palliative care service. Patient looks comfortable and currently on morphine drip and comfort treatment. Patient likely will have hospital death. Continue supportive care.  DVT prophylaxis: Comfort care Code Status: DO NOT RESUSCITATE Family Communication: No family at bedside Disposition Plan: Likely hospital death    Consultants:   Palliative care service  Pulmonary critical care service Subjective: Looks comfortable. Unable to obtain review of system ,unresponsive.  Objective: Vitals:   07/25/16 0600 07/25/16 0800 07/25/16 2002 07/26/16 0522  BP:  (!) 103/56  (!) 65/37  Pulse: 87 82  95  Resp: (!) 22 18  12    Temp:  97.5 F (36.4 C)    TempSrc:  Axillary    SpO2: 94% 95% (!) 75% (!) 80%  Weight:      Height:        Intake/Output Summary (Last 24 hours) at 07/26/16 1558 Last data filed at 07/26/16 0317  Gross per 24 hour  Intake           109.62 ml  Output              150 ml  Net           -40.38 ml   Filed Weights   07/22/16 0608 07/23/16 0438 07/24/16 0300  Weight: 41.5 kg (91 lb 7.9 oz) 43.7 kg (96 lb 5.5 oz) 47.5 kg (104 lb 11.5 oz)    Examination:  General exam: Unresponsive, low comfortable Respiratory system: Clear bilateral Cardiovascular system: S1 & S2 heard, RRR.    Data Reviewed: I have personally reviewed following labs and imaging studies  CBC:  Recent Labs Lab 07/20/16 1505 07/21/16 0503 07/23/16 0929 07/23/16 1130 07/24/16 0323  WBC 25.9* 10.8* 14.6* 15.5* 16.1*  NEUTROABS 23.8* 9.8* 12.8* 13.6*  --   HGB 8.8* 9.3* 6.3* 6.5* 8.3*  HCT 25.8* 29.0* 18.0* 18.5* 24.0*  MCV 86.3 89.0 83.7 82.6 83.9  PLT 315 366 224 230 818   Basic Metabolic Panel:  Recent Labs Lab 07/20/16 0439 07/21/16 0200 07/21/16 0500 07/22/16 0729 07/22/16 1124 07/22/16 1845 07/23/16 0929 07/23/16 1955 07/24/16 0323  NA 135 138 135  --   --   --  136  --  137  K 4.2  4.1 4.9  --   --   --  3.2*  --  4.4  CL 101 113* 104  --   --   --  104  --  105  CO2 26 20* 23  --   --   --  23  --  24  GLUCOSE 103* 169* 121*  --   --   --  138*  --  120*  BUN 17 20 27*  --   --   --  23*  --  19  CREATININE 0.76 0.66 0.93 1.09*  --   --  0.80  --  0.69  0.68  CALCIUM 9.2 6.8* 8.7*  --   --   --  8.3*  --  8.4*  MG  --  1.6*  --   --  2.3 2.4 2.1 2.0  --   PHOS  --   --   --   --  3.2 3.0 2.0* 2.4*  --    GFR: Estimated Creatinine Clearance: 35.7 mL/min (by C-G formula based on SCr of 0.68 mg/dL). Liver Function Tests:  Recent Labs Lab 07/20/16 0439 07/21/16 0503 07/23/16 0929  AST 22 27 21   ALT 18 20 13*  ALKPHOS 70 85 64  BILITOT 0.5 0.3 0.8  PROT 5.3* 5.1* 4.9*    ALBUMIN 2.5* 2.2* 2.7*   No results for input(s): LIPASE, AMYLASE in the last 168 hours. No results for input(s): AMMONIA in the last 168 hours. Coagulation Profile: No results for input(s): INR, PROTIME in the last 168 hours. Cardiac Enzymes: No results for input(s): CKTOTAL, CKMB, CKMBINDEX, TROPONINI in the last 168 hours. BNP (last 3 results) No results for input(s): PROBNP in the last 8760 hours. HbA1C: No results for input(s): HGBA1C in the last 72 hours. CBG:  Recent Labs Lab 07/23/16 1224 07/23/16 1808 07/23/16 1913 07/23/16 2331 07/24/16 0545  GLUCAP 152* 119* 119* 125* 124*   Lipid Profile: No results for input(s): CHOL, HDL, LDLCALC, TRIG, CHOLHDL, LDLDIRECT in the last 72 hours. Thyroid Function Tests: No results for input(s): TSH, T4TOTAL, FREET4, T3FREE, THYROIDAB in the last 72 hours. Anemia Panel: No results for input(s): VITAMINB12, FOLATE, FERRITIN, TIBC, IRON, RETICCTPCT in the last 72 hours. Sepsis Labs:  Recent Labs Lab 07/21/16 0503 07/21/16 1023 07/22/16 0729 07/23/16 0929  PROCALCITON 4.19  --  14.82 8.58  LATICACIDVEN 1.5 2.0*  --   --     Recent Results (from the past 240 hour(s))  Culture, expectorated sputum-assessment     Status: None   Collection Time: 07/21/16  3:40 AM  Result Value Ref Range Status   Specimen Description TRACHEAL ASPIRATE  Final   Special Requests NONE  Final   Sputum evaluation THIS SPECIMEN IS ACCEPTABLE FOR SPUTUM CULTURE  Final   Report Status 07/21/2016 FINAL  Final  Culture, respiratory (NON-Expectorated)     Status: None   Collection Time: 07/21/16  3:40 AM  Result Value Ref Range Status   Specimen Description TRACHEAL ASPIRATE  Final   Special Requests NONE Reflexed from [id number]  Final   Gram Stain   Final    FEW WBC PRESENT, PREDOMINANTLY PMN RARE GRAM POSITIVE RODS RARE GRAM POSITIVE COCCI IN PAIRS RARE BUDDING YEAST SEEN    Culture   Final    Consistent with normal respiratory flora. Performed  at [facility], [place] [address], [place], [place] [address]    Report Status 07/23/2016 FINAL  Final  MRSA PCR Screening  Status: None   Collection Time: 07/21/16  5:09 AM  Result Value Ref Range Status   MRSA by PCR NEGATIVE NEGATIVE Final    Comment:        The GeneXpert MRSA Assay (FDA approved for NASAL specimens only), is one component of a comprehensive MRSA colonization surveillance program. It is not intended to diagnose MRSA infection nor to guide or monitor treatment for MRSA infections.   Culture, blood (routine x 2)     Status: None (Preliminary result)   Collection Time: 07/21/16  5:51 AM  Result Value Ref Range Status   Specimen Description BLOOD LEFT ARM  Final   Special Requests IN PEDIATRIC BOTTLE Blood Culture adequate volume  Final   Culture   Final    NO GROWTH 4 DAYS Performed at [facility], [address], [place], [place] [address]    Report Status PENDING  Incomplete  Culture, blood (routine x 2)     Status: None (Preliminary result)   Collection Time: 07/21/16  6:20 AM  Result Value Ref Range Status   Specimen Description BLOOD RIGHT HAND  Final   Special Requests IN PEDIATRIC BOTTLE Blood Culture adequate volume  Final   Culture   Final    NO GROWTH 4 DAYS Performed at [facility], [place] [address] [place], [place] [address]    Report Status PENDING  Incomplete         Radiology Studies: Dg Chest Port 1 View  Result Date: 07/25/2016 CLINICAL DATA:  Follow-up right pneumothorax EXAM: PORTABLE CHEST 1 VIEW COMPARISON:  07/24/2016 FINDINGS: Cardiac shadow is stable. A feeding catheter is noted within the stomach. Right-sided PICC line and right-sided pigtail thoracostomy catheter are seen and stable. No pneumothorax is noted bilateral pleural effusions right greater than left are noted as well as bilateral infiltrates stable from the prior exam. IMPRESSION: Overall stable appearance of the chest. No recurrent pneumothorax is  noted. Electronically Signed   By: Inez Catalina M.D.   On: 07/25/2016 08:34        Scheduled Meds: . atropine  2 drop Sublingual QID  . mouth rinse  15 mL Mouth Rinse BID  . scopolamine  1 patch Transdermal Q72H  . sodium chloride flush  10-40 mL Intracatheter Q12H   Continuous Infusions: . morphine 7.5 mg/hr (07/25/16 1241)     LOS: 13 days    Demarques Pilz Tanna Furry, MD [facility] Pager [phone]  If 7PM-7AM, please contact night-coverage [email or web address] Password [facility] 07/26/2016, 3:58 PM

## 2016-08-05 NOTE — Death Summary Note (Addendum)
Death Summary  Kelly Drake LNL:[id number] DOB: 04-19-1930 DOA: 2016-08-08  PCP: Rusty Aus, MD  Admit date: 08-Aug-2016 Date of Death: 08/22/2016 Time of Death: 01:59 AM Notification: Rusty Aus, MD notified of death of Aug 22, 2016   History of present illness:  83 year old female with history of hiatal hernia, COPD admitted for failure to thrive, aspiration pneumonia in the setting of large hiatal hernia who developed acute respiratory failure requiring intubation. After intubation patient was found to have right pneumothorax status post chest tube was placed. She was extubated. Required pressors for septic shock. The chest tube was removed. After discussion with the family the patient was made comfort care, started on morphine by critical care team and transferred to [facility]. Patient with poor prognosis. Palliative care team was consulted. Patient remained unresponsive, on morphine drip. Patient passed away in the night. Please review prior notes for details. Patient was DO NOT RESUSCITATE, comfort care and was evaluated by palliative care service.  I was not present during patient's death. I found out this when I came to hospital this morning.    The results of significant diagnostics from this hospitalization (including imaging, microbiology, ancillary and laboratory) are listed below for reference.    Significant Diagnostic Studies: Dg Chest 2 View  Result Date: 07/15/2016 CLINICAL DATA:  Altered mental status. Status post fall this morning. History of COPD. EXAM: CHEST  2 VIEW COMPARISON:  PA and lateral chest 10/15/2013. FINDINGS: The patient is rotated on the examination. There is a right pleural effusion and basilar airspace disease. The lungs are emphysematous. Heart size is normal atherosclerosis noted. Hiatal hernia is noted. IMPRESSION: Right pleural effusion and basilar airspace disease which could be due to atelectasis, pneumonia or aspiration. COPD.  Atherosclerosis. Hiatal hernia. Electronically Signed   By: Inge Rise M.D.   On: 07/15/2016 13:13   Dg Abd 1 View  Result Date: 07/14/2016 CLINICAL DATA:  Vomiting today. EXAM: ABDOMEN - 1 VIEW COMPARISON:  CT of 01/26/2010. FINDINGS: Cardiomegaly. Trace right pleural fluid or thickening. No gaseous distention of bowel loops. Nonspecific paucity of large and small bowel gas. Distal stool identified. No abnormal abdominal calcifications. No appendicolith. Convex right lumbar spine curvature. IMPRESSION: No acute findings.  Nonspecific paucity of abdominopelvic bowel gas. Electronically Signed   By: Abigail Miyamoto M.D.   On: 07/14/2016 17:11   Ct Head Wo Contrast  Result Date: 07/15/2016 CLINICAL DATA:  83 year old female status post fall from bed this morning. Confusion. EXAM: CT HEAD WITHOUT CONTRAST TECHNIQUE: Contiguous axial images were obtained from the base of the skull through the vertex without intravenous contrast. COMPARISON:  Report of [facility] head CT 05/19/1996 (no images available). FINDINGS: Brain: 5-6 mm coarse calcification along the right anterior operculum seems to be cortically based. In abnormality was described in a similar location in 1998. Cerebral volume is within normal limits for age. No midline shift, ventriculomegaly, mass effect, evidence of mass lesion, intracranial hemorrhage or evidence of cortically based acute infarction. Patchy bilateral white matter hypodensity, maximal in the posterior hemispheres. Vascular: Calcified atherosclerosis at the skull base. No suspicious intracranial vascular hyperdensity. Skull: Intact.  No acute osseous abnormality identified. Sinuses/Orbits: Clear. Other: No scalp hematoma or acute orbit soft tissue finding identified. IMPRESSION: 1. No acute traumatic injury or acute intracranial abnormality identified. 2. Dystrophic appearing calcification at the right anterior operculum is nonspecific, but is chronic judging  from the 1998 head CT report, and significance is doubtful. 3. Mild to  moderate for age cerebral white matter changes most commonly due to chronic small vessel disease. Electronically Signed   By: Genevie Ann M.D.   On: 07/15/2016 11:50   Dg Chest Port 1 View  Result Date: 07/25/2016 CLINICAL DATA:  Follow-up right pneumothorax EXAM: PORTABLE CHEST 1 VIEW COMPARISON:  07/24/2016 FINDINGS: Cardiac shadow is stable. A feeding catheter is noted within the stomach. Right-sided PICC line and right-sided pigtail thoracostomy catheter are seen and stable. No pneumothorax is noted bilateral pleural effusions right greater than left are noted as well as bilateral infiltrates stable from the prior exam. IMPRESSION: Overall stable appearance of the chest. No recurrent pneumothorax is noted. Electronically Signed   By: Inez Catalina M.D.   On: 07/25/2016 08:34   Dg Chest Port 1 View  Result Date: 07/24/2016 CLINICAL DATA:  Recent right-sided chest tube placement EXAM: PORTABLE CHEST 1 VIEW COMPARISON:  07/23/2016 FINDINGS: Cardiac shadow is mildly enlarged but stable. A feeding catheter is noted within the stomach. Right-sided PICC line is noted at the cavoatrial junction. A right pigtail thoracostomy catheter is noted in the apex. No pneumothorax is seen. Increasing right pleural effusion is noted. Stable right basilar infiltrate is seen with some increase in patchy infiltrates in the left upper and left lower lobes. IMPRESSION: Increase in bilateral infiltrates particularly on the left. No definitive pneumothorax. Enlarging right pleural effusion. Electronically Signed   By: Inez Catalina M.D.   On: 07/24/2016 10:25   Dg Chest Port 1 View  Result Date: 07/23/2016 CLINICAL DATA:  Respiratory failure. EXAM: PORTABLE CHEST 1 VIEW COMPARISON:  07/21/2016. FINDINGS: Interim placement of feeding tube, its tip is coiled in the stomach. Endotracheal tube, right PICC line, right chest tube in stable position. Tiny residual right  apical pneumothorax cannot be excluded. Slight worsening of right lung infiltrate and atelectasis. Small right pleural effusion again noted. Heart size stable. IMPRESSION: 1. Interim placement of feeding tube, its tip is coiled in the stomach. Endotracheal tube, right PICC line, right chest tube in stable position. Tiny residual right apical pneumothorax cannot be excluded. 2. Slight worsening of right lung infiltrate and atelectasis. Small right pleural effusion again noted. Electronically Signed   By: Marcello Moores  Register   On: 07/23/2016 06:47   Dg Chest Port 1 View  Result Date: 07/21/2016 CLINICAL DATA:  ETT replaced per ordering note; pt id checked with RN EXAM: PORTABLE CHEST 1 VIEW COMPARISON:  07/21/2016 FINDINGS: Endotracheal tube is in place with tip approximately 4-5 cm above the carina. A right PICC line tip overlies the level of superior vena cava. A pigtail type catheter overlies the right lung apex. Small right apical pneumothorax persists but measures less than 5% estimated lung volume. There are bilateral pleural effusions. Heart size is normal. There has been improvement in aeration of the lung bases. IMPRESSION: 1. Improved aeration. 2. Stable appearance of right apical pneumothorax. Electronically Signed   By: Nolon Nations M.D.   On: 07/21/2016 11:18   Dg Chest Port 1 View  Result Date: 07/21/2016 CLINICAL DATA:  Status post right-sided chest tube placement. Initial encounter. EXAM: PORTABLE CHEST 1 VIEW COMPARISON:  Chest radiograph performed earlier today at 1:17 a.m. FINDINGS: There has been interval placement of a right-sided chest tube, with tiny residual right apical pneumothorax. New prominent soft tissue air is noted about the right chest wall. The patient's endotracheal tube is seen ending 4-5 cm above the carina. A right PICC is noted ending about the mid SVC. Increasing right-sided airspace  opacification may reflect atelectasis or pneumonia. Underlying vascular congestion is  noted. Small bilateral pleural effusions are seen, and mild underlying interstitial edema is a concern. The cardiomediastinal silhouette is borderline normal in size. No acute osseous abnormalities are identified. External pacing pads are noted. IMPRESSION: 1. Interval placement of right-sided chest tube, with tiny residual right apical pneumothorax. New prominent soft tissue air noted about the right chest wall. 2. Endotracheal tube seen ending 4-5 cm above the carina. 3. Increasing right-sided airspace opacification may reflect atelectasis or pneumonia. 4. Underlying vascular congestion noted. Small bilateral pleural effusions noted. Mild underlying interstitial edema is a concern. Electronically Signed   By: Garald Balding M.D.   On: 07/21/2016 03:41   Portable Chest Xray  Result Date: 07/21/2016 CLINICAL DATA:  Intubation EXAM: PORTABLE CHEST 1 VIEW COMPARISON:  07/21/2016, 07/15/2016 FINDINGS: Endotracheal tube tip is approximately 4.1 cm superior to the carina right upper extremity catheter tip overlies the SVC. Esophageal tube is coiled back upon itself over the mid mediastinum. Interim development of moderate to large right pneumothorax estimated at 50%. Small left pleural effusion and continued left basilar atelectasis or infiltrate. Stable cardiomediastinal silhouette with moderate to large hiatal hernia. IMPRESSION: 1. Endotracheal tube tip about 4.1 cm superior to the carina 2. Interim development of moderate to large right-sided pneumothorax. There is very slight shift of mediastinal contents to the left. 3. Small left pleural effusion with left basilar atelectasis or infiltrate. 4. Esophageal tube is folded back upon itself over the mid mediastinum 5. Large hiatal hernia Critical Value/emergent results were called by telephone at the time of interpretation on 07/21/2016 at 2:11 am to Dr. Benjamine Mola DETERDING , who verbally acknowledged these results. Electronically Signed   By: Donavan Foil M.D.    On: 07/21/2016 02:11   Dg Chest Port 1 View  Result Date: 07/21/2016 CLINICAL DATA:  Acute respiratory failure EXAM: PORTABLE CHEST 1 VIEW COMPARISON:  07/15/2016 FINDINGS: A right upper extremity catheter tip overlies the SVC. Worsening consolidation in the left lung base with probable small left effusion. Minimal improved aeration of the right base with residual hazy pulmonary opacity and small right effusion. Stable cardiomediastinal silhouette. No pneumothorax. Hiatal hernia IMPRESSION: 1. Interval worsening of left lung base atelectasis or pneumonia with small left pleural effusion now present 2. Continued right pleural effusion with hazy infiltrate in the right lung base with minimal improved aeration at the right base. Electronically Signed   By: Donavan Foil M.D.   On: 07/21/2016 01:16   Dg Abd Portable 1v  Result Date: 07/22/2016 CLINICAL DATA:  Evaluate tube placement. EXAM: PORTABLE ABDOMEN - 1 VIEW COMPARISON:  July 21, 2016 FINDINGS: A tube extends through the esophagus into the stomach. The right pleural effusion and underlying opacity is seen. IMPRESSION: A tube extends through the esophagus, terminating in the stomach. Right pleural effusion and underlying atelectasis. Electronically Signed   By: Dorise Bullion III M.D   On: 07/22/2016 12:52   Dg Abd Portable 1v  Result Date: 07/21/2016 CLINICAL DATA:  Feeding tube placement EXAM: PORTABLE ABDOMEN - 1 VIEW COMPARISON:  07/19/2016 FINDINGS: Enteric tube is looped back upon itself over the mid mediastinum. Partially visualized large pneumothorax at the right lung base. IMPRESSION: 1. Enteral tube is looped back upon itself over the mid mediastinum. 2. Large right basilar pneumothorax. Critical Value/emergent results were called by telephone at the time of interpretation on 07/21/2016 at 2:11 am to Dr. Benjamine Mola DETERDING , who verbally acknowledged these results. Electronically Signed  By: Donavan Foil M.D.   On: 07/21/2016 02:11   Dg  Abd Portable 1v  Result Date: 07/19/2016 CLINICAL DATA:  Vomiting and mid abdominal pain EXAM: PORTABLE ABDOMEN - 1 VIEW COMPARISON:  07/14/2016 FINDINGS: Cardiomegaly with left basilar atelectasis or infiltrate. Relatively gasless abdomen. Stable calcification in the left pelvis. IMPRESSION: 1. Cardiomegaly with left basilar atelectasis or infiltrate 2. Nonspecific paucity of abdominal gas Electronically Signed   By: Donavan Foil M.D.   On: 07/19/2016 01:34    Microbiology: Recent Results (from the past 240 hour(s))  Culture, expectorated sputum-assessment     Status: None   Collection Time: 07/21/16  3:40 AM  Result Value Ref Range Status   Specimen Description TRACHEAL ASPIRATE  Final   Special Requests NONE  Final   Sputum evaluation THIS SPECIMEN IS ACCEPTABLE FOR SPUTUM CULTURE  Final   Report Status 07/21/2016 FINAL  Final  Culture, respiratory (NON-Expectorated)     Status: None   Collection Time: 07/21/16  3:40 AM  Result Value Ref Range Status   Specimen Description TRACHEAL ASPIRATE  Final   Special Requests NONE Reflexed from [id number]  Final   Gram Stain   Final    FEW WBC PRESENT, PREDOMINANTLY PMN RARE GRAM POSITIVE RODS RARE GRAM POSITIVE COCCI IN PAIRS RARE BUDDING YEAST SEEN    Culture   Final    Consistent with normal respiratory flora. Performed at [facility], [place] [address], [place], [place] [address]    Report Status 07/23/2016 FINAL  Final  MRSA PCR Screening     Status: None   Collection Time: 07/21/16  5:09 AM  Result Value Ref Range Status   MRSA by PCR NEGATIVE NEGATIVE Final    Comment:        The GeneXpert MRSA Assay (FDA approved for NASAL specimens only), is one component of a comprehensive MRSA colonization surveillance program. It is not intended to diagnose MRSA infection nor to guide or monitor treatment for MRSA infections.   Culture, blood (routine x 2)     Status: None   Collection Time: 07/21/16  5:51 AM  Result Value Ref  Range Status   Specimen Description BLOOD LEFT ARM  Final   Special Requests IN PEDIATRIC BOTTLE Blood Culture adequate volume  Final   Culture   Final    NO GROWTH 5 DAYS Performed at [facility], [place] [address] [place], [place] [address]    Report Status 07/26/2016 FINAL  Final  Culture, blood (routine x 2)     Status: None   Collection Time: 07/21/16  6:20 AM  Result Value Ref Range Status   Specimen Description BLOOD RIGHT HAND  Final   Special Requests IN PEDIATRIC BOTTLE Blood Culture adequate volume  Final   Culture   Final    NO GROWTH 5 DAYS Performed at [facility], [place] [address] [place], [place] [address]    Report Status 07/26/2016 FINAL  Final     Labs: Basic Metabolic Panel:  Recent Labs Lab 07/21/16 0200 07/21/16 0500 07/22/16 0729 07/22/16 1124 07/22/16 1845 07/23/16 0929 07/23/16 1955 07/24/16 0323  NA 138 135  --   --   --  136  --  137  K 4.1 4.9  --   --   --  3.2*  --  4.4  CL 113* 104  --   --   --  104  --  105  CO2 20* 23  --   --   --  23  --  24  GLUCOSE 169* 121*  --   --   --  138*  --  120*  BUN 20 27*  --   --   --  23*  --  19  CREATININE 0.66 0.93 1.09*  --   --  0.80  --  0.69  0.68  CALCIUM 6.8* 8.7*  --   --   --  8.3*  --  8.4*  MG 1.6*  --   --  2.3 2.4 2.1 2.0  --   PHOS  --   --   --  3.2 3.0 2.0* 2.4*  --    Liver Function Tests:  Recent Labs Lab 07/21/16 0503 07/23/16 0929  AST 27 21  ALT 20 13*  ALKPHOS 85 64  BILITOT 0.3 0.8  PROT 5.1* 4.9*  ALBUMIN 2.2* 2.7*   No results for input(s): LIPASE, AMYLASE in the last 168 hours. No results for input(s): AMMONIA in the last 168 hours. CBC:  Recent Labs Lab 07/20/16 1505 07/21/16 0503 07/23/16 0929 07/23/16 1130 07/24/16 0323  WBC 25.9* 10.8* 14.6* 15.5* 16.1*  NEUTROABS 23.8* 9.8* 12.8* 13.6*  --   HGB 8.8* 9.3* 6.3* 6.5* 8.3*  HCT 25.8* 29.0* 18.0* 18.5* 24.0*  MCV 86.3 89.0 83.7 82.6 83.9  PLT 315 366 224 230 242   Cardiac Enzymes: No  results for input(s): CKTOTAL, CKMB, CKMBINDEX, TROPONINI in the last 168 hours. D-Dimer No results for input(s): DDIMER in the last 72 hours. BNP: Invalid input(s): POCBNP CBG:  Recent Labs Lab 07/23/16 1224 07/23/16 1808 07/23/16 1913 07/23/16 2331 07/24/16 0545  GLUCAP 152* 119* 119* 125* 124*   Anemia work up No results for input(s): VITAMINB12, FOLATE, FERRITIN, TIBC, IRON, RETICCTPCT in the last 72 hours. Urinalysis    Component Value Date/Time   COLORURINE YELLOW 07/21/2016 0517   APPEARANCEUR HAZY (A) 07/21/2016 0517   APPEARANCEUR Clear 04/13/2016 0858   LABSPEC 1.015 07/21/2016 0517   PHURINE 6.0 07/21/2016 0517   GLUCOSEU 50 (A) 07/21/2016 0517   HGBUR NEGATIVE 07/21/2016 0517   BILIRUBINUR NEGATIVE 07/21/2016 0517   BILIRUBINUR Negative 04/13/2016 0858   KETONESUR NEGATIVE 07/21/2016 0517   PROTEINUR NEGATIVE 07/21/2016 0517   NITRITE NEGATIVE 07/21/2016 0517   LEUKOCYTESUR NEGATIVE 07/21/2016 0517   LEUKOCYTESUR Negative 04/13/2016 0858   Sepsis Labs Invalid input(s): PROCALCITONIN,  WBC,  LACTICIDVEN     SIGNED:  Rosita Fire, MD  [facility] 01-Aug-2016, 1:32 PM Pager   If 7PM-7AM, please contact night-coverage [email or web address] Password [email or web address]

## 2016-08-05 NOTE — Progress Notes (Signed)
Wasted 230 mL of morphine drip in the sink.  Witnessed with Carin Hock, RN.

## 2016-08-05 NOTE — Progress Notes (Signed)
Found patient to be unresponsive.  No heart sounds auscultated and no respirations noted.  Carin Hock, RN was second Therapist, sports to pronounce death at 19.  Son Chip was notified of his mother's death.

## 2016-08-05 DEATH — deceased

## 2017-04-12 ENCOUNTER — Other Ambulatory Visit: Payer: PPO | Admitting: Urology

## 2018-03-22 IMAGING — RF DG ESOPHAGUS
14 of 19 series · 14 of 19 positions shown · non-contrast
Comparison: Chest x-ray of October 15, 2013 which revealed a
moderate-sized hiatal hernia.

CLINICAL DATA: History of difficulty swallowing, gastroesophageal
reflux, previous esophageal dilation. History of COPD and bladder
malignancy.

EXAM:
ESOPHOGRAM / BARIUM SWALLOW / BARIUM TABLET STUDY
TECHNIQUE: Combined double contrast and single contrast examination performed
using effervescent crystals, thick barium liquid, and thin barium
liquid. The patient was observed with fluoroscopy swallowing a 13 mm
barium sulphate tablet.
FLUOROSCOPY TIME:  Fluoroscopy Time:  1 minutes, 54 seconds
Radiation Exposure Index (if provided by the fluoroscopic device):
[id number]
Number of Acquired Spot Images: 19

[Series 1: fluoro_barium 2fps_bw · 0.20mm/px · 1 of 1 slices shown (1 of 14)]
[im 1/1]
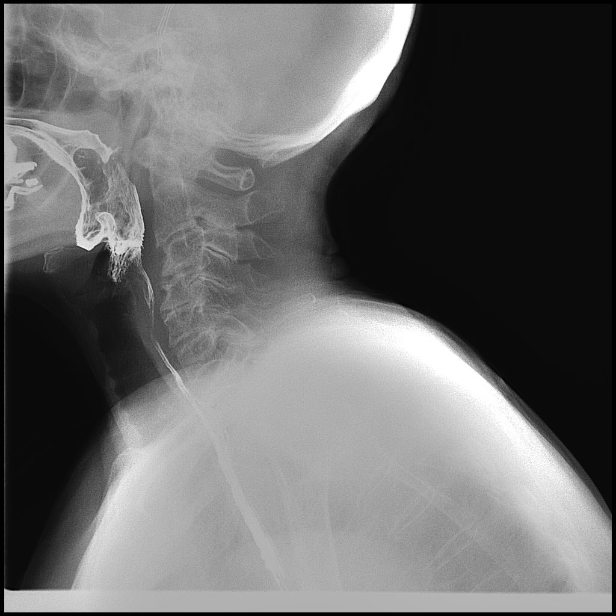

[Series 3: fluoro_barium 2fps_bw · 0.19mm/px · 1 of 1 slices shown (2 of 14)]
[im 1/1]
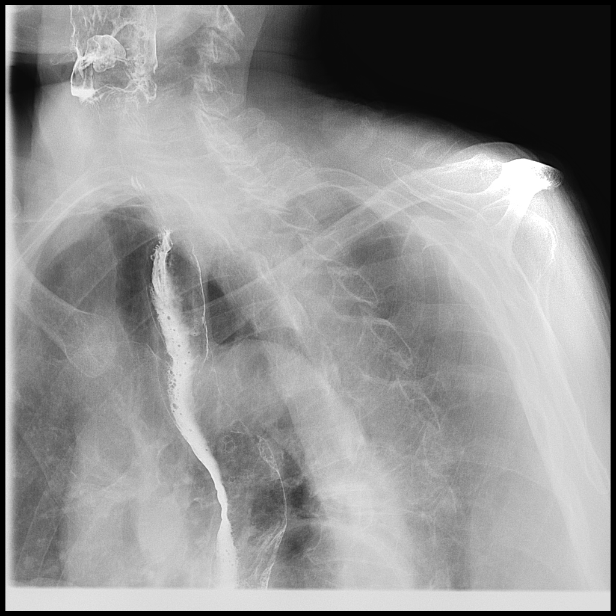

[Series 4: fluoro_barium 2fps_bw · 0.19mm/px · 1 of 1 slices shown (3 of 14)]
[im 1/1]
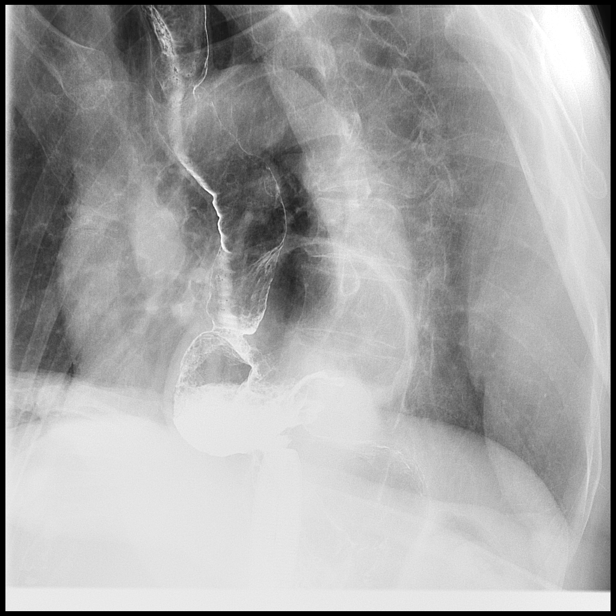

[Series 5: fluoro_barium 2fps_bw · 0.19mm/px · 1 of 1 slices shown (4 of 14)]
[im 1/1]
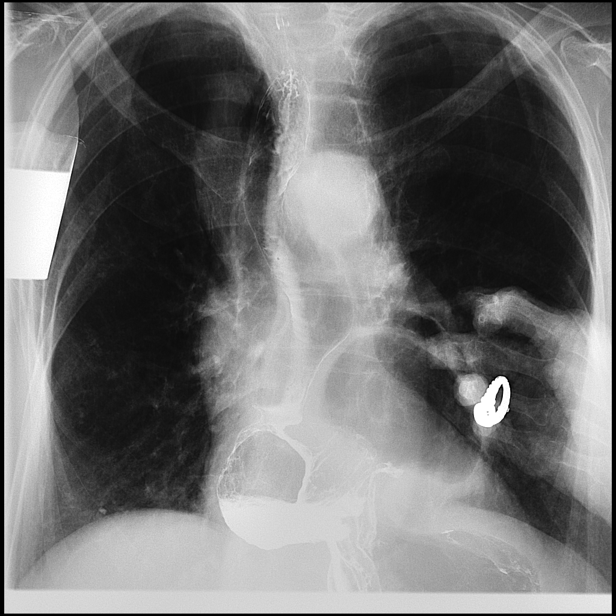

[Series 7: fluoro_barium 2fps_bw · 0.18mm/px · 1 of 1 slices shown (5 of 14)]
[im 1/1]
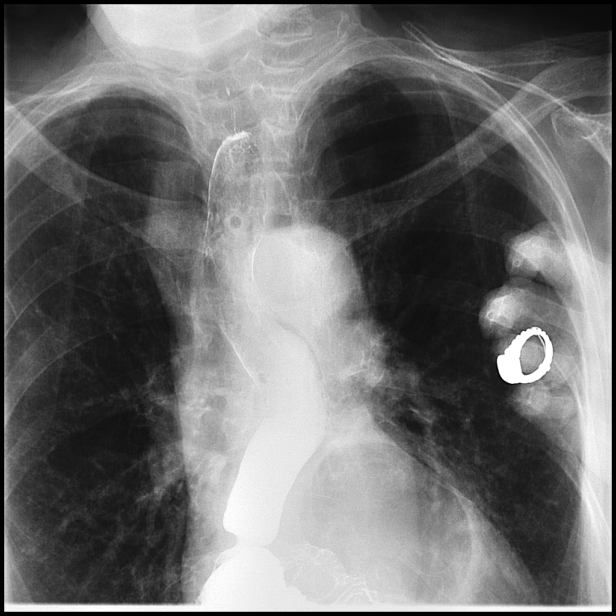

[Series 8: fluoro_barium 2fps_bw · 0.18mm/px · 1 of 1 slices shown (6 of 14)]
[im 1/1]
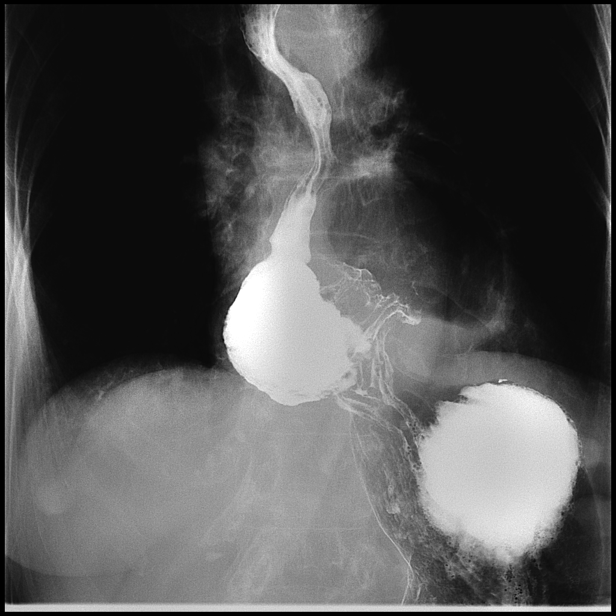

[Series 9: fluoro_barium 2fps_bw · 0.18mm/px · 1 of 1 slices shown (7 of 14)]
[im 1/1]
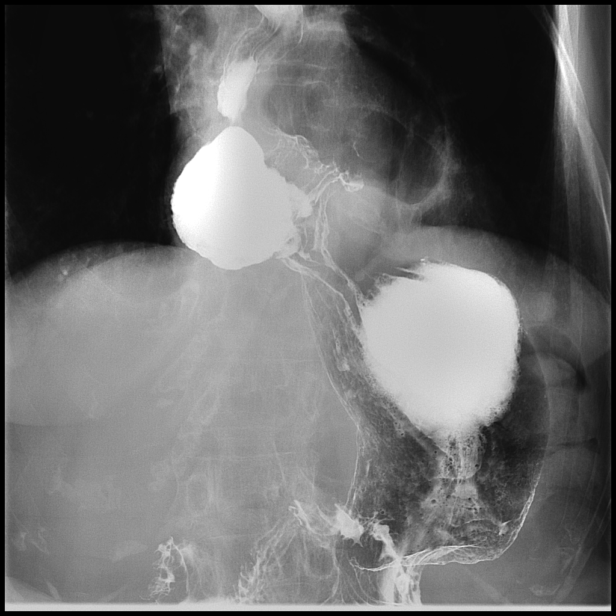

[Series 11: fluoro_barium 2fps_bw · 0.18mm/px · 1 of 1 slices shown (8 of 14)]
[im 1/1]
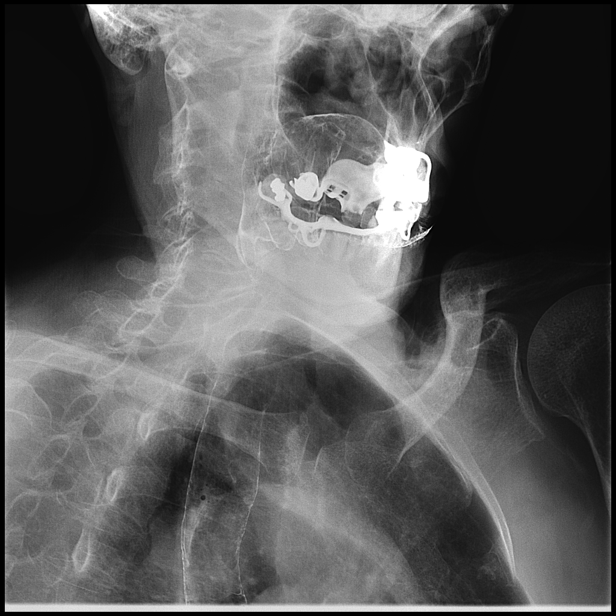

[Series 12: fluoro_barium 2fps_bw · 0.18mm/px · 1 of 1 slices shown (9 of 14)]
[im 1/1]
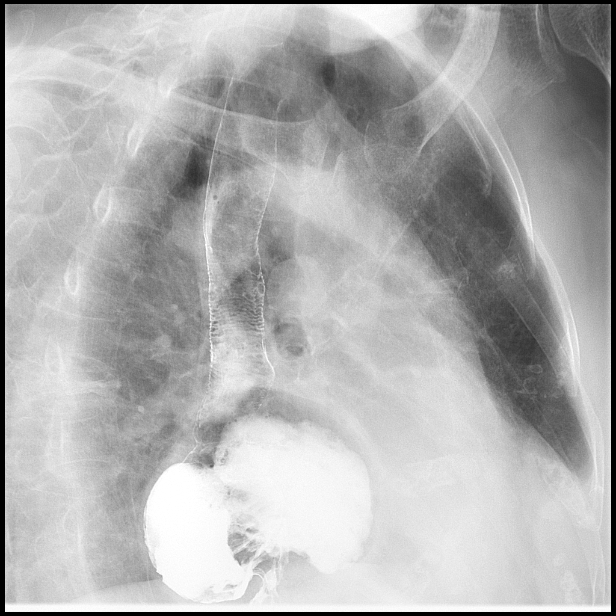

[Series 13: fluoro_barium 2fps_bw · 0.18mm/px · 1 of 1 slices shown (10 of 14)]
[im 1/1]
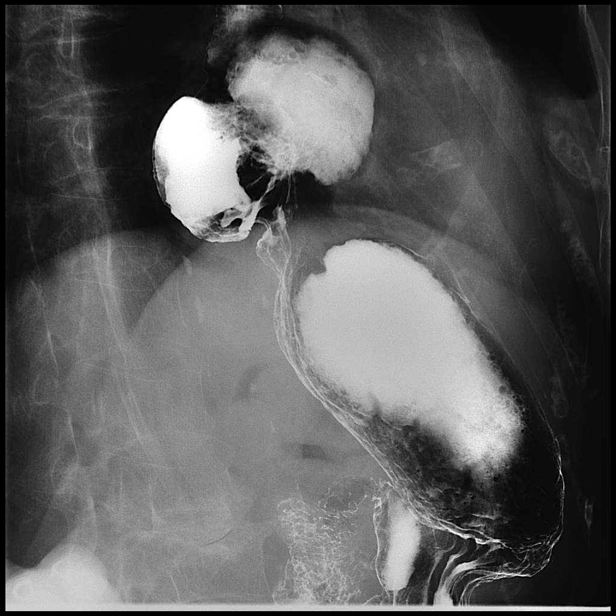

[Series 15: fluoro_barium 2fps_bw · 0.18mm/px · 1 of 1 slices shown (11 of 14)]
[im 1/1]
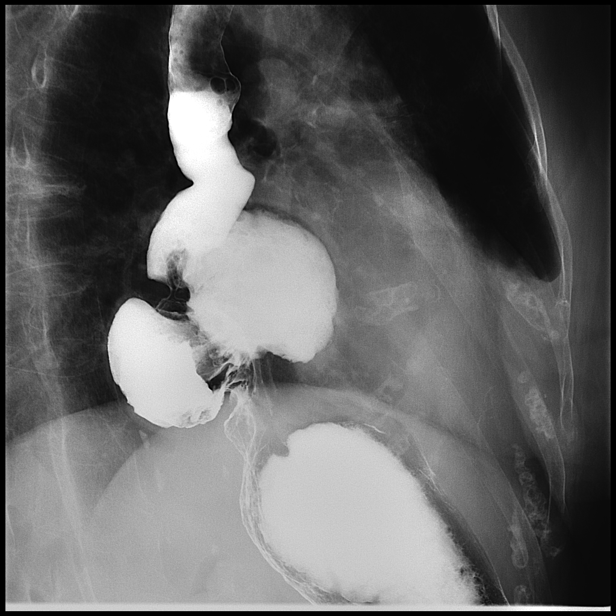

[Series 16: fluoro_barium 2fps_bw · 0.19mm/px · 1 of 1 slices shown (12 of 14)]
[im 1/1]
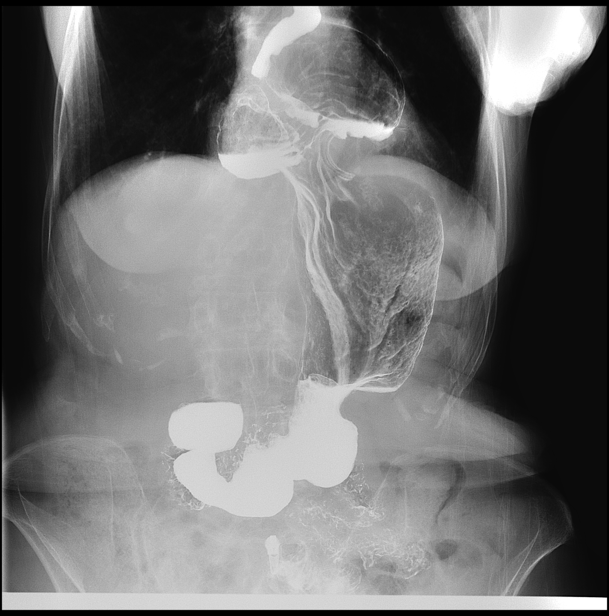

[Series 17: fluoro_barium 2fps_bw · 0.19mm/px · 1 of 1 slices shown (13 of 14)]
[im 1/1]
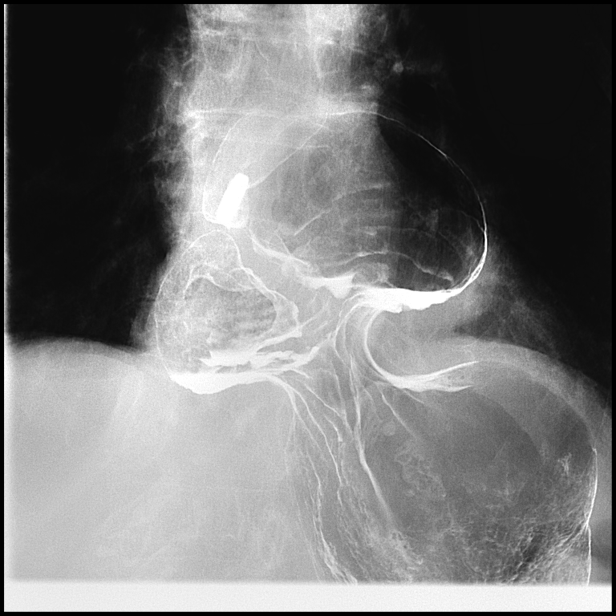

[Series 19: fluoro_barium 2fps_bw · 0.19mm/px · 1 of 1 slices shown (14 of 14)]
[im 1/1]
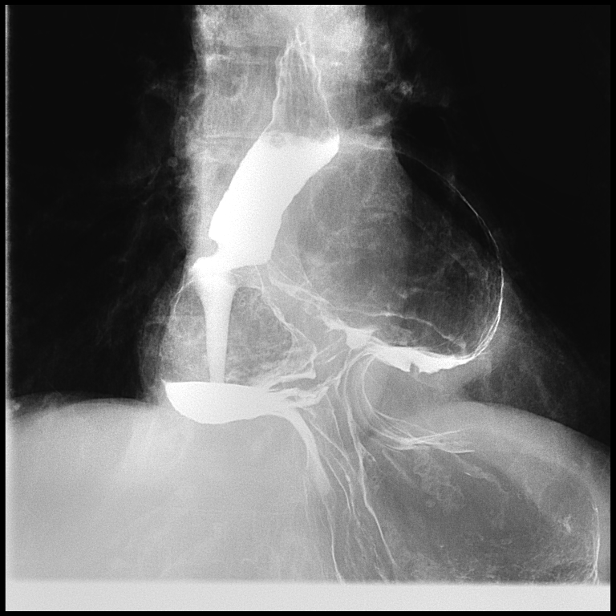

[14 of 19 positions shown; findings below may reference images not displayed]

FINDINGS: The patient ingested the thick and thin barium and gas forming
crystals without difficulty. The cervical esophagus distended well.
There was no laryngeal penetration of the barium. Moderate cervical
spine degenerative disc disease was noted without significant impact
upon the cervical esophagus. The thoracic esophagus distended well
down to the junction with a large hiatal hernia-partially intra
thoracic stomach. There was no evidence of ulceration or mass. The
barium tablet hung at the level of the GE junction but then passed
into the stomach spontaneously. The intra thoracic portion of the
stomach appeared normal where visualized. The opening in the
esophageal hiatus appears widely patent. No evidence of volvulus
currently.
IMPRESSION: Cervical esophageal function appeared normal. The thoracic esophagus
appeared normal to the level of the GE junction. Large hiatal
hernia-partially intrathoracic stomach. Mild narrowing of the GE
junction which did allow passage of the 13 mm barium tablet. No
evidence of ulceration or mass.

No gastroesophageal reflux was observed but certainly the patient is
at increased risk for such reflux given the large hiatal
hernia-partially intra thoracic stomach.
